# Patient Record
Sex: Male | Born: 1955 | Race: Black or African American | Hispanic: No | Marital: Married
Health system: Southern US, Community
[De-identification: ages and names within clinical notes are randomized; demographics above are authoritative.]

## PROBLEM LIST (undated history)

## (undated) DIAGNOSIS — J189 Pneumonia, unspecified organism: Secondary | ICD-10-CM

## (undated) DIAGNOSIS — R6520 Severe sepsis without septic shock: Secondary | ICD-10-CM

## (undated) DIAGNOSIS — A419 Sepsis, unspecified organism: Secondary | ICD-10-CM

## (undated) DIAGNOSIS — U071 COVID-19: Secondary | ICD-10-CM

## (undated) DIAGNOSIS — J9621 Acute and chronic respiratory failure with hypoxia: Secondary | ICD-10-CM

## (undated) DIAGNOSIS — J1282 Pneumonia due to coronavirus disease 2019: Secondary | ICD-10-CM

---

## 2019-07-22 ENCOUNTER — Other Ambulatory Visit (HOSPITAL_COMMUNITY): Payer: Medicare HMO

## 2019-07-22 ENCOUNTER — Inpatient Hospital Stay
Admission: AD | Admit: 2019-07-22 | Discharge: 2019-08-19 | Disposition: A | Discharge status: Skilled Nursing Facility | Payer: Medicare HMO | Source: Other Acute Inpatient Hospital | Attending: Internal Medicine | Admitting: Internal Medicine

## 2019-07-22 DIAGNOSIS — J9621 Acute and chronic respiratory failure with hypoxia: Secondary | ICD-10-CM | POA: Diagnosis present

## 2019-07-22 DIAGNOSIS — U071 COVID-19: Secondary | ICD-10-CM | POA: Diagnosis present

## 2019-07-22 DIAGNOSIS — J189 Pneumonia, unspecified organism: Secondary | ICD-10-CM

## 2019-07-22 DIAGNOSIS — J1282 Pneumonia due to coronavirus disease 2019: Secondary | ICD-10-CM | POA: Diagnosis present

## 2019-07-22 DIAGNOSIS — R1110 Vomiting, unspecified: Secondary | ICD-10-CM

## 2019-07-22 DIAGNOSIS — R0902 Hypoxemia: Secondary | ICD-10-CM

## 2019-07-22 DIAGNOSIS — K567 Ileus, unspecified: Secondary | ICD-10-CM

## 2019-07-22 DIAGNOSIS — R6520 Severe sepsis without septic shock: Secondary | ICD-10-CM | POA: Diagnosis present

## 2019-07-22 DIAGNOSIS — A419 Sepsis, unspecified organism: Secondary | ICD-10-CM | POA: Diagnosis present

## 2019-07-22 DIAGNOSIS — Z931 Gastrostomy status: Secondary | ICD-10-CM

## 2019-07-22 DIAGNOSIS — Z9289 Personal history of other medical treatment: Secondary | ICD-10-CM

## 2019-07-22 HISTORY — DX: Pneumonia due to coronavirus disease 2019: J12.82

## 2019-07-22 HISTORY — DX: Sepsis, unspecified organism: R65.20

## 2019-07-22 HISTORY — DX: Pneumonia, unspecified organism: J18.9

## 2019-07-22 HISTORY — DX: Sepsis, unspecified organism: A41.9

## 2019-07-22 HISTORY — DX: Acute and chronic respiratory failure with hypoxia: J96.21

## 2019-07-22 HISTORY — DX: COVID-19: U07.1

## 2019-07-22 LAB — BLOOD GAS, ARTERIAL
Acid-Base Excess: 4.8 mmol/L — ABNORMAL HIGH (ref 0.0–2.0)
Bicarbonate: 28.2 mmol/L — ABNORMAL HIGH (ref 20.0–28.0)
FIO2: 28
O2 Saturation: 98.9 %
Patient temperature: 36.4
pCO2 arterial: 36.8 mmHg (ref 32.0–48.0)
pH, Arterial: 7.494 — ABNORMAL HIGH (ref 7.350–7.450)
pO2, Arterial: 116 mmHg — ABNORMAL HIGH (ref 83.0–108.0)

## 2019-07-23 ENCOUNTER — Encounter: Payer: Self-pay | Admitting: Internal Medicine

## 2019-07-23 DIAGNOSIS — J189 Pneumonia, unspecified organism: Secondary | ICD-10-CM | POA: Diagnosis present

## 2019-07-23 DIAGNOSIS — J9621 Acute and chronic respiratory failure with hypoxia: Secondary | ICD-10-CM

## 2019-07-23 DIAGNOSIS — A419 Sepsis, unspecified organism: Secondary | ICD-10-CM | POA: Diagnosis not present

## 2019-07-23 DIAGNOSIS — J1282 Pneumonia due to coronavirus disease 2019: Secondary | ICD-10-CM | POA: Diagnosis present

## 2019-07-23 DIAGNOSIS — R6520 Severe sepsis without septic shock: Secondary | ICD-10-CM

## 2019-07-23 DIAGNOSIS — U071 COVID-19: Secondary | ICD-10-CM | POA: Diagnosis present

## 2019-07-23 LAB — COMPREHENSIVE METABOLIC PANEL
ALT: 18 U/L (ref 0–44)
AST: 37 U/L (ref 15–41)
Albumin: 1.9 g/dL — ABNORMAL LOW (ref 3.5–5.0)
Alkaline Phosphatase: 107 U/L (ref 38–126)
Anion gap: 9 (ref 5–15)
BUN: 8 mg/dL (ref 8–23)
CO2: 23 mmol/L (ref 22–32)
Calcium: 8.7 mg/dL — ABNORMAL LOW (ref 8.9–10.3)
Chloride: 100 mmol/L (ref 98–111)
Creatinine, Ser: <0.3 mg/dL — ABNORMAL LOW (ref 0.61–1.24)
Glucose, Bld: 137 mg/dL — ABNORMAL HIGH (ref 70–99)
Potassium: 4.4 mmol/L (ref 3.5–5.1)
Sodium: 132 mmol/L — ABNORMAL LOW (ref 135–145)
Total Bilirubin: 1.5 mg/dL — ABNORMAL HIGH (ref 0.3–1.2)
Total Protein: 6.4 g/dL — ABNORMAL LOW (ref 6.5–8.1)

## 2019-07-23 LAB — CBC WITH DIFFERENTIAL/PLATELET
Abs Immature Granulocytes: 0.03 10*3/uL (ref 0.00–0.07)
Basophils Absolute: 0 10*3/uL (ref 0.0–0.1)
Basophils Relative: 0 %
Eosinophils Absolute: 0.1 10*3/uL (ref 0.0–0.5)
Eosinophils Relative: 2 %
HCT: 31.2 % — ABNORMAL LOW (ref 39.0–52.0)
Hemoglobin: 10 g/dL — ABNORMAL LOW (ref 13.0–17.0)
Immature Granulocytes: 0 %
Lymphocytes Relative: 13 %
Lymphs Abs: 1 10*3/uL (ref 0.7–4.0)
MCH: 26 pg (ref 26.0–34.0)
MCHC: 32.1 g/dL (ref 30.0–36.0)
MCV: 81.3 fL (ref 80.0–100.0)
Monocytes Absolute: 0.7 10*3/uL (ref 0.1–1.0)
Monocytes Relative: 10 %
Neutro Abs: 5.6 10*3/uL (ref 1.7–7.7)
Neutrophils Relative %: 75 %
Platelets: 295 10*3/uL (ref 150–400)
RBC: 3.84 MIL/uL — ABNORMAL LOW (ref 4.22–5.81)
RDW: 17.8 % — ABNORMAL HIGH (ref 11.5–15.5)
WBC: 7.5 10*3/uL (ref 4.0–10.5)
nRBC: 0 % (ref 0.0–0.2)

## 2019-07-23 MED ORDER — INSULIN GLARGINE 100 UNIT/ML SOLOSTAR PEN
10.00 | PEN_INJECTOR | SUBCUTANEOUS | Status: DC
Start: 2019-07-23 — End: 2019-07-23

## 2019-07-23 MED ORDER — ARFORMOTEROL TARTRATE 15 MCG/2ML IN NEBU
15.00 | INHALATION_SOLUTION | RESPIRATORY_TRACT | Status: DC
Start: 2019-07-22 — End: 2019-07-23

## 2019-07-23 MED ORDER — POTASSIUM CHLORIDE 20 MEQ PO PACK
20.00 | PACK | ORAL | Status: DC
Start: 2019-07-23 — End: 2019-07-23

## 2019-07-23 MED ORDER — ASPIRIN 81 MG PO CHEW
81.00 | CHEWABLE_TABLET | ORAL | Status: DC
Start: 2019-07-23 — End: 2019-07-23

## 2019-07-23 MED ORDER — IPRATROPIUM-ALBUTEROL 0.5-2.5 (3) MG/3ML IN SOLN
3.00 | RESPIRATORY_TRACT | Status: DC
Start: ? — End: 2019-07-23

## 2019-07-23 MED ORDER — METOCLOPRAMIDE HCL 10 MG/10ML PO SOLN
15.00 | ORAL | Status: DC
Start: 2019-07-22 — End: 2019-07-23

## 2019-07-23 MED ORDER — PANTOPRAZOLE SODIUM 40 MG IV SOLR
40.00 | INTRAVENOUS | Status: DC
Start: 2019-07-23 — End: 2019-07-23

## 2019-07-23 MED ORDER — LORAZEPAM 2 MG/ML IJ SOLN
1.00 | INTRAMUSCULAR | Status: DC
Start: ? — End: 2019-07-23

## 2019-07-23 MED ORDER — CYANOCOBALAMIN 500 MCG PO TABS
500.00 | ORAL_TABLET | ORAL | Status: DC
Start: 2019-07-23 — End: 2019-07-23

## 2019-07-23 MED ORDER — IPRATROPIUM-ALBUTEROL 0.5-2.5 (3) MG/3ML IN SOLN
3.00 | RESPIRATORY_TRACT | Status: DC
Start: 2019-07-22 — End: 2019-07-23

## 2019-07-23 MED ORDER — ACETYLCYSTEINE 20 % IN SOLN
2.00 | RESPIRATORY_TRACT | Status: DC
Start: 2019-07-22 — End: 2019-07-23

## 2019-07-23 MED ORDER — THERA-M PO TABS
1.00 | ORAL_TABLET | ORAL | Status: DC
Start: 2019-07-23 — End: 2019-07-23

## 2019-07-23 MED ORDER — DEXTROSE 50 % IV SOLN
50.00 | INTRAVENOUS | Status: DC
Start: ? — End: 2019-07-23

## 2019-07-23 MED ORDER — INSULIN REGULAR HUMAN 100 UNIT/ML IJ SOLN
0.00 | INTRAMUSCULAR | Status: DC
Start: 2019-07-22 — End: 2019-07-23

## 2019-07-23 MED ORDER — BUDESONIDE 0.5 MG/2ML IN SUSP
0.50 | RESPIRATORY_TRACT | Status: DC
Start: 2019-07-23 — End: 2019-07-23

## 2019-07-23 MED ORDER — COCONUT OIL OIL
TOPICAL_OIL | Status: DC
Start: ? — End: 2019-07-23

## 2019-07-23 NOTE — Consult Note (Signed)
Pulmonary [place]  Date of Service: 07/23/2019  PULMONARY CRITICAL CARE CONSULT   Caleb Rhodes  KZL:[id number]  DOB: [birth year]   DOA: 07/22/2019  Referring Physician: Merton Border, MD  HPI: Caleb Rhodes is a 64 y.o. male seen for follow up of Acute on Chronic Respiratory Failure.  Patient has multiple medical problems including chronic alcohol abuse anxiety diabetes mellitus depression hypertension hyperlipidemia malignant neoplasm of the prostate pneumonia presents to the hospital for altered mental status.  The patient was seen by the wife and was not acting right.  Patient does have some issues with dementia.  In the emergency department patient had to be intubated because of the climbing respiratory status.  Patient had a CT angiogram done which did not show any acute abnormalities.  CT angiogram was also done which was unremarkable.  Hospital course was as follows.  CT angiogram revealed presence of bibasilar pneumonia patient apparently had bibasilar pneumonia on the CT scan.  The patient also was tested for COVID-19.  There was no evidence of pulmonary embolism noted.  Patient required ongoing sedation because of his history of dementia.  Transferred to our facility for further management and weaning.  Review of Systems:  ROS performed and is unremarkable other than noted above.  Medical Hx: Past Medical History:  Diagnosis Date  . Alcohol abuse  . Anxiety  . Colon cancer (CMS-HCC)  . Depression  . DM type 2 (diabetes mellitus, type 2) (CMS-HCC)  . H/O echocardiogram  08/30/16 EF 60%  . History of EKG  09/04/16 S TACH, LT ANT FAS BLK  . History of EKG  08/11/17 NSR, FAS. BLOCK,CONSIDER ANT.ISCHEMIA DR Caleb Rhodes  . Hyperlipemia  . Hypertension, benign  . Malignant neoplasm prostate (CMS-HCC)  . Plantar fibromatosis  . Pneumonia  08/2016  . Stroke (CMS-HCC)  08/2016  . Tobacco abuse   Surgical Hx: Past Surgical  History:  Procedure Laterality Date  . ANKLE FRACTURE SURGERY  . CARPAL TUNNEL RELEASE  . Metastatic colorectal cancer 2012  . PR AMPUTATION FOOT,TRANSMETATARSAL Right 05/14/2019  Procedure: AMPUTATION, FOOT; TRANSMETATARSAL; Surgeon: Ardeth Sportsman, DPM; Location: [facility]; Service: Podiatry  . PR COLONOSCOPY FLX DX W/COLLJ SPEC WHEN PFRMD N/A 05/01/2017  Procedure: COLONOSCOPY, FLEXIBLE, PROXIMAL TO SPLENIC FLEXURE; DIAGNOSTIC, W/WO COLLECTION SPECIMEN BY BRUSH OR [place]; Surgeon: Annie Main, MD; Location: [facility]; Service: General Surgery  . PR DEBRIDEMENT, SKIN, SUB-Q TISSUE,MUSCLE,BONE,=<20 SQ CM Right 01/17/2019  Procedure: DEBRIDEMENT; SKIN, SUBCUTANEOUS TISSUE, MUSCLE, & BONE FOOT; Surgeon: Gweneth Fritter, DPM; Location: [facility]; Service: Podiatry  . PR UPPER GI ENDOSCOPY,BIOPSY N/A 09/06/2016  Procedure: UGI ENDOSCOPY; WITH BIOPSY, SINGLE OR MULTIPLE, POSSIBLE CONTROL OF BLEEDING; Surgeon: Annie Main, MD; Location: [facility]; Service: General Surgery  . PR UPPER GI ENDOSCOPY,DIAGNOSIS N/A 05/01/2017  Procedure: UGI ENDO, INCLUDE ESOPHAGUS, STOMACH, & DUODENUM &/OR JEJUNUM; DX W/WO COLLECTION SPECIMN, BY BRUSH OR [place]; Surgeon: Annie Main, MD; Location: [facility]; Service: General Surgery  . PROSTATE SURGERY  . Surgery for preforated sigmoid colon 2006    Medications: Reviewed on Rounds  Physical Exam:  Vitals: Temperature 98.7 pulse 87 respiratory 24 blood pressure is 114/67 saturations 99%  Ventilator Settings mode ventilation pressure support FiO2 28% pressure support 12 PEEP 5  . General: Comfortable at this time . Eyes: Grossly normal lids, irises & conjunctiva . ENT: grossly tongue is normal . Neck: no obvious mass . Cardiovascular: S1-S2 normal  no gallop or rub . Respiratory: No rhonchi coarse breath sounds . Abdomen: Soft and nontender . Skin: no rash seen on limited exam . Musculoskeletal: not  rigid . Psychiatric:unable to assess . Neurologic: no seizure no involuntary movements         Labs on Admission:  Basic Metabolic Panel: Recent Labs  Lab 07/23/19 0652  NA 132*  K 4.4  CL 100  CO2 23  GLUCOSE 137*  BUN 8  CREATININE <0.30*  CALCIUM 8.7*    Recent Labs  Lab 07/22/19 1855  PHART 7.494*  PCO2ART 36.8  PO2ART 116*  HCO3 28.2*  O2SAT 98.9    Liver Function Tests: Recent Labs  Lab 07/23/19 0652  AST 37  ALT 18  ALKPHOS 107  BILITOT 1.5*  PROT 6.4*  ALBUMIN 1.9*   No results for input(s): LIPASE, AMYLASE in the last 168 hours. No results for input(s): AMMONIA in the last 168 hours.  CBC: No results for input(s): WBC, NEUTROABS, HGB, HCT, MCV, PLT in the last 168 hours.  Cardiac Enzymes: No results for input(s): CKTOTAL, CKMB, CKMBINDEX, TROPONINI in the last 168 hours.  BNP (last 3 results) No results for input(s): BNP in the last 8760 hours.  ProBNP (last 3 results) No results for input(s): PROBNP in the last 8760 hours.   Radiological Exams on Admission: DG Abd 1 View  Result Date: 07/22/2019 CLINICAL DATA:  Status post percutaneous gastrostomy tube placement EXAM: ABDOMEN - 1 VIEW COMPARISON:  None. FINDINGS: 50 mL Omnipaque 300 injected into patient's gastrostomy tube. Gastrostomy balloon appears positioned within the proximal body of the stomach. Contrast opacifies the stomach. No gross extravasation. Nonobstructed gas pattern IMPRESSION: Gastrostomy tube projects within the stomach. No gross extravasation Electronically Signed   By: Jasmine Pang M.D.   On: 07/22/2019 18:37   DG CHEST PORT 1 VIEW  Result Date: 07/22/2019 CLINICAL DATA:  History of pneumonia EXAM: PORTABLE CHEST 1 VIEW COMPARISON:  None. FINDINGS: Tracheostomy tube in place with tip about 4.7 cm superior to the carina. Left upper extremity central venous catheter tip over the distal SVC. Mild right infrahilar airspace opacity. No pleural effusion. Normal heart size. No  pneumothorax. IMPRESSION: Mild right infrahilar atelectasis or small pneumonia. Minimal streaky atelectasis in the left infrahilar lung Electronically Signed   By: Jasmine Pang M.D.   On: 07/22/2019 18:38    Assessment/Plan Active Problems:   Acute on chronic respiratory failure with hypoxia (HCC)   Severe sepsis (HCC)   Multifocal pneumonia   COVID-19 virus infection   Pneumonia due to COVID-19 virus   1. Acute on chronic respiratory failure hypoxia patient right now is on the weaning protocol goals for about 2 hours on pressure support 12/5.  Patient so far appears to be doing well.  Concern is over the agitation which will need to be monitored closely. 2. Severe sepsis with shock right now hemodynamics are stable we will continue to monitor closely.  Patient had been on pressors at the other facility. 3. Multifocal pneumonia patient has been treated with cefepime vancomycin azithromycin as well as Rocephin.  Will monitor closely. 4. COVID-19 pneumonia patient has been treated clinically showing some residual deficits on the chest films. 5. COVID-19 virus infection in the resolution phase we will continue to follow along.  I have personally seen and evaluated the patient, evaluated laboratory and imaging results, formulated the assessment and plan and placed orders. The Patient requires high complexity decision making with multiple systems involvement.  Case  was discussed on Rounds with the Respiratory Therapy Director and the Respiratory staff Time Spent  Yevonne Pax, MD [facility] Pulmonary Critical Care Medicine Sleep Medicine

## 2019-07-24 ENCOUNTER — Other Ambulatory Visit (HOSPITAL_COMMUNITY): Payer: Medicare HMO

## 2019-07-24 DIAGNOSIS — A419 Sepsis, unspecified organism: Secondary | ICD-10-CM | POA: Diagnosis not present

## 2019-07-24 DIAGNOSIS — U071 COVID-19: Secondary | ICD-10-CM | POA: Diagnosis not present

## 2019-07-24 DIAGNOSIS — J9621 Acute and chronic respiratory failure with hypoxia: Secondary | ICD-10-CM | POA: Diagnosis not present

## 2019-07-24 DIAGNOSIS — J189 Pneumonia, unspecified organism: Secondary | ICD-10-CM | POA: Diagnosis not present

## 2019-07-24 LAB — URINALYSIS, ROUTINE W REFLEX MICROSCOPIC
Glucose, UA: NEGATIVE mg/dL
Ketones, ur: 15 mg/dL — AB
Nitrite: NEGATIVE
Protein, ur: 100 mg/dL — AB
Specific Gravity, Urine: 1.015 (ref 1.005–1.030)
pH: 7.5 (ref 5.0–8.0)

## 2019-07-24 LAB — URINALYSIS, MICROSCOPIC (REFLEX): RBC / HPF: >50 RBC/hpf (ref 0–5)

## 2019-07-24 NOTE — Progress Notes (Addendum)
Pulmonary Critical Care Medicine [place]   PULMONARY CRITICAL CARE SERVICE  PROGRESS NOTE  Date of Service: 07/24/2019  Caleb Rhodes  ZOX:[id number]  DOB: Jul 12, 1955   DOA: 07/22/2019  Referring Physician: Merton Border, MD  HPI: Caleb Rhodes is a 64 y.o. male seen for follow up of Acute on Chronic Respiratory Failure.  Patient had a 48-hour goal today on pressure support completed 4 hours and is working towards 8 hours currently on 12/5 FiO2 28% satting well no distress.  Medications: Reviewed on Rounds  Physical Exam:  Vitals: Pulse 107 respirations 20 BP 114/64 O2 sat 9% temp 97.9  Ventilator Settings per support 12/5 FiO2 28%  . General: Comfortable at this time . Eyes: Grossly normal lids, irises & conjunctiva . ENT: grossly tongue is normal . Neck: no obvious mass . Cardiovascular: S1 S2 normal no gallop . Respiratory: Coarse breath sounds . Abdomen: soft . Skin: no rash seen on limited exam . Musculoskeletal: not rigid . Psychiatric:unable to assess . Neurologic: no seizure no involuntary movements         Lab Data:   Basic Metabolic Panel: Recent Labs  Lab 07/23/19 0652  NA 132*  K 4.4  CL 100  CO2 23  GLUCOSE 137*  BUN 8  CREATININE <0.30*  CALCIUM 8.7*    ABG: Recent Labs  Lab 07/22/19 1855  PHART 7.494*  PCO2ART 36.8  PO2ART 116*  HCO3 28.2*  O2SAT 98.9    Liver Function Tests: Recent Labs  Lab 07/23/19 0652  AST 37  ALT 18  ALKPHOS 107  BILITOT 1.5*  PROT 6.4*  ALBUMIN 1.9*   No results for input(s): LIPASE, AMYLASE in the last 168 hours. No results for input(s): AMMONIA in the last 168 hours.  CBC: Recent Labs  Lab 07/23/19 0904  WBC 7.5  NEUTROABS 5.6  HGB 10.0*  HCT 31.2*  MCV 81.3  PLT 295    Cardiac Enzymes: No results for input(s): CKTOTAL, CKMB, CKMBINDEX, TROPONINI in the last 168 hours.  BNP (last 3 results) No results for input(s): BNP in the last 8760 hours.  ProBNP (last 3  results) No results for input(s): PROBNP in the last 8760 hours.  Radiological Exams: DG Abd 1 View  Result Date: 07/22/2019 CLINICAL DATA:  Status post percutaneous gastrostomy tube placement EXAM: ABDOMEN - 1 VIEW COMPARISON:  None. FINDINGS: 50 mL Omnipaque 300 injected into patient's gastrostomy tube. Gastrostomy balloon appears positioned within the proximal body of the stomach. Contrast opacifies the stomach. No gross extravasation. Nonobstructed gas pattern IMPRESSION: Gastrostomy tube projects within the stomach. No gross extravasation Electronically Signed   By: Donavan Foil M.D.   On: 07/22/2019 18:37   DG CHEST PORT 1 VIEW  Result Date: 07/22/2019 CLINICAL DATA:  History of pneumonia EXAM: PORTABLE CHEST 1 VIEW COMPARISON:  None. FINDINGS: Tracheostomy tube in place with tip about 4.7 cm superior to the carina. Left upper extremity central venous catheter tip over the distal SVC. Mild right infrahilar airspace opacity. No pleural effusion. Normal heart size. No pneumothorax. IMPRESSION: Mild right infrahilar atelectasis or small pneumonia. Minimal streaky atelectasis in the left infrahilar lung Electronically Signed   By: Donavan Foil M.D.   On: 07/22/2019 18:38    Assessment/Plan Active Problems:   Acute on chronic respiratory failure with hypoxia (HCC)   Severe sepsis (HCC)   Multifocal pneumonia   COVID-19 virus infection   Pneumonia due to COVID-19 virus   1. Acute on chronic respiratory  failure hypoxia continue to wean per protocol.  Currently going for 8 hours per support this time.  Continue supportive measures and pulmonary toilet. 2. Severe sepsis with shock right now hemodynamics are stable we will continue to monitor closely.  Patient had been on pressors at the other facility. 3. Multifocal pneumonia patient has been treated with cefepime vancomycin azithromycin as well as Rocephin.  Will monitor closely. 4. COVID-19 pneumonia patient has been treated clinically  showing some residual deficits on the chest films. 5. COVID-19 virus infection in the resolution phase we will continue to follow along.   I have personally seen and evaluated the patient, evaluated laboratory and imaging results, formulated the assessment and plan and placed orders. The Patient requires high complexity decision making with multiple systems involvement.  Rounds were done with the Respiratory Therapy Director and Staff therapists and discussed with nursing staff also.  Yevonne Pax, MD [facility] Pulmonary Critical Care Medicine Sleep Medicine

## 2019-07-25 DIAGNOSIS — J9621 Acute and chronic respiratory failure with hypoxia: Secondary | ICD-10-CM | POA: Diagnosis not present

## 2019-07-25 DIAGNOSIS — U071 COVID-19: Secondary | ICD-10-CM | POA: Diagnosis not present

## 2019-07-25 DIAGNOSIS — J189 Pneumonia, unspecified organism: Secondary | ICD-10-CM | POA: Diagnosis not present

## 2019-07-25 DIAGNOSIS — A419 Sepsis, unspecified organism: Secondary | ICD-10-CM | POA: Diagnosis not present

## 2019-07-25 LAB — BASIC METABOLIC PANEL
Anion gap: 9 (ref 5–15)
BUN: 6 mg/dL — ABNORMAL LOW (ref 8–23)
CO2: 27 mmol/L (ref 22–32)
Calcium: 8.8 mg/dL — ABNORMAL LOW (ref 8.9–10.3)
Chloride: 101 mmol/L (ref 98–111)
Creatinine, Ser: <0.3 mg/dL — ABNORMAL LOW (ref 0.61–1.24)
Glucose, Bld: 173 mg/dL — ABNORMAL HIGH (ref 70–99)
Potassium: 3.3 mmol/L — ABNORMAL LOW (ref 3.5–5.1)
Sodium: 137 mmol/L (ref 135–145)

## 2019-07-25 LAB — CBC
HCT: 30 % — ABNORMAL LOW (ref 39.0–52.0)
Hemoglobin: 9.4 g/dL — ABNORMAL LOW (ref 13.0–17.0)
MCH: 25.8 pg — ABNORMAL LOW (ref 26.0–34.0)
MCHC: 31.3 g/dL (ref 30.0–36.0)
MCV: 82.2 fL (ref 80.0–100.0)
Platelets: 308 10*3/uL (ref 150–400)
RBC: 3.65 MIL/uL — ABNORMAL LOW (ref 4.22–5.81)
RDW: 17.3 % — ABNORMAL HIGH (ref 11.5–15.5)
WBC: 6.2 10*3/uL (ref 4.0–10.5)
nRBC: 0 % (ref 0.0–0.2)

## 2019-07-25 LAB — URINE CULTURE: Culture: NO GROWTH

## 2019-07-25 NOTE — Progress Notes (Addendum)
Pulmonary Critical Care Medicine [facility] [place]   PULMONARY CRITICAL CARE SERVICE  PROGRESS NOTE  Date of Service: 07/25/2019  Caleb Rhodes  DEY:[id number]  DOB: [birth year]   DOA: 07/22/2019  Referring Physician: Carron Curie, MD  HPI: Caleb Rhodes is a 64 y.o. male seen for follow up of Acute on Chronic Respiratory Failure.  Patient remains on pressure support with an FiO2 28% currently satting well no fever distress.  Medications: Reviewed on Rounds  Physical Exam:  Vitals: Pulse 89 respirations 23 BP 118/70 O2 sat 99% temp 98.3  Ventilator Settings per support 12/5 FiO2 28%  . General: Comfortable at this time . Eyes: Grossly normal lids, irises & conjunctiva . ENT: grossly tongue is normal . Neck: no obvious mass . Cardiovascular: S1 S2 normal no gallop . Respiratory: No rales or rhonchi noted . Abdomen: soft . Skin: no rash seen on limited exam . Musculoskeletal: not rigid . Psychiatric:unable to assess . Neurologic: no seizure no involuntary movements         Lab Data:   Basic Metabolic Panel: Recent Labs  Lab 07/23/19 0652 07/25/19 0617  NA 132* 137  K 4.4 3.3*  CL 100 101  CO2 23 27  GLUCOSE 137* 173*  BUN 8 6*  CREATININE <0.30* <0.30*  CALCIUM 8.7* 8.8*    ABG: Recent Labs  Lab 07/22/19 1855  PHART 7.494*  PCO2ART 36.8  PO2ART 116*  HCO3 28.2*  O2SAT 98.9    Liver Function Tests: Recent Labs  Lab 07/23/19 0652  AST 37  ALT 18  ALKPHOS 107  BILITOT 1.5*  PROT 6.4*  ALBUMIN 1.9*   No results for input(s): LIPASE, AMYLASE in the last 168 hours. No results for input(s): AMMONIA in the last 168 hours.  CBC: Recent Labs  Lab 07/23/19 0904 07/25/19 0617  WBC 7.5 6.2  NEUTROABS 5.6  --   HGB 10.0* 9.4*  HCT 31.2* 30.0*  MCV 81.3 82.2  PLT 295 308    Cardiac Enzymes: No results for input(s): CKTOTAL, CKMB, CKMBINDEX, TROPONINI in the last 168 hours.  BNP (last 3 results) No results for input(s): BNP in  the last 8760 hours.  ProBNP (last 3 results) No results for input(s): PROBNP in the last 8760 hours.  Radiological Exams: DG Abd 1 View  Result Date: 07/24/2019 CLINICAL DATA:  Emesis. EXAM: ABDOMEN - 1 VIEW COMPARISON:  July 22, 2019 FINDINGS: The PEG tube again projects over the left upper quadrant. Contrast injected through the PEG tube 2 days ago is now located within the colon. No evidence of bowel obstruction. No free air, portal venous gas, or pneumatosis. No other acute abnormalities are identified. IMPRESSION: No cause for the patient's symptoms identified. Electronically Signed   By: Gerome Sam III M.D   On: 07/24/2019 18:24    Assessment/Plan Active Problems:   Acute on chronic respiratory failure with hypoxia (HCC)   Severe sepsis (HCC)   Multifocal pneumonia   COVID-19 virus infection   Pneumonia due to COVID-19 virus   1. Acute on chronic respiratory failure hypoxia continue to wean per protocol.  Currently going for 12 hours per support this time.  Continue supportive measures and pulmonary toilet. 2. Severe sepsis with shock right now hemodynamics are stable we will continue to monitor closely. Patient had been on pressors at the other facility. 3. Multifocal pneumonia patient has been treated with cefepime vancomycin azithromycin as well as Rocephin. Will monitor closely. 4. COVID-19 pneumonia patient has been  treated clinically showing some residual deficits on the chest films. 5. COVID-19 virus infection in the resolution phase we will continue to follow along.   I have personally seen and evaluated the patient, evaluated laboratory and imaging results, formulated the assessment and plan and placed orders. The Patient requires high complexity decision making with multiple systems involvement.  Rounds were done with the Respiratory Therapy Director and Staff therapists and discussed with nursing staff also.  Allyne Gee, MD [facility] Pulmonary Critical Care  Medicine Sleep Medicine

## 2019-07-26 DIAGNOSIS — J189 Pneumonia, unspecified organism: Secondary | ICD-10-CM | POA: Diagnosis not present

## 2019-07-26 DIAGNOSIS — U071 COVID-19: Secondary | ICD-10-CM | POA: Diagnosis not present

## 2019-07-26 DIAGNOSIS — J9621 Acute and chronic respiratory failure with hypoxia: Secondary | ICD-10-CM | POA: Diagnosis not present

## 2019-07-26 DIAGNOSIS — A419 Sepsis, unspecified organism: Secondary | ICD-10-CM | POA: Diagnosis not present

## 2019-07-26 NOTE — Progress Notes (Signed)
Pulmonary Critical Care Medicine [facility] [place]   PULMONARY CRITICAL CARE SERVICE  PROGRESS NOTE  Date of Service: 07/26/2019  Caleb Rhodes  DJM:[id number]  DOB: Jul 31, 1955   DOA: 07/22/2019  Referring Physician: Carron Curie, MD  HPI: Caleb Rhodes is a 64 y.o. male seen for follow up of Acute on Chronic Respiratory Failure.  Patient is on pressure support mode and today's goal is for about 16 hours  Medications: Reviewed on Rounds  Physical Exam:  Vitals: Temperature 98.7 pulse 86 respiratory 19 blood pressure is 102/65 saturations 98%  Ventilator Settings mode of ventilation pressure support FiO2 28% pressure 12 PEEP 5  . General: Comfortable at this time . Eyes: Grossly normal lids, irises & conjunctiva . ENT: grossly tongue is normal . Neck: no obvious mass . Cardiovascular: S1 S2 normal no gallop . Respiratory: No rhonchi no rales are noted at this time . Abdomen: soft . Skin: no rash seen on limited exam . Musculoskeletal: not rigid . Psychiatric:unable to assess . Neurologic: no seizure no involuntary movements         Lab Data:   Basic Metabolic Panel: Recent Labs  Lab 07/23/19 0652 07/25/19 0617  NA 132* 137  K 4.4 3.3*  CL 100 101  CO2 23 27  GLUCOSE 137* 173*  BUN 8 6*  CREATININE <0.30* <0.30*  CALCIUM 8.7* 8.8*    ABG: Recent Labs  Lab 07/22/19 1855  PHART 7.494*  PCO2ART 36.8  PO2ART 116*  HCO3 28.2*  O2SAT 98.9    Liver Function Tests: Recent Labs  Lab 07/23/19 0652  AST 37  ALT 18  ALKPHOS 107  BILITOT 1.5*  PROT 6.4*  ALBUMIN 1.9*   No results for input(s): LIPASE, AMYLASE in the last 168 hours. No results for input(s): AMMONIA in the last 168 hours.  CBC: Recent Labs  Lab 07/23/19 0904 07/25/19 0617  WBC 7.5 6.2  NEUTROABS 5.6  --   HGB 10.0* 9.4*  HCT 31.2* 30.0*  MCV 81.3 82.2  PLT 295 308    Cardiac Enzymes: No results for input(s): CKTOTAL, CKMB, CKMBINDEX, TROPONINI in the last 168  hours.  BNP (last 3 results) No results for input(s): BNP in the last 8760 hours.  ProBNP (last 3 results) No results for input(s): PROBNP in the last 8760 hours.  Radiological Exams: DG Abd 1 View  Result Date: 07/24/2019 CLINICAL DATA:  Emesis. EXAM: ABDOMEN - 1 VIEW COMPARISON:  July 22, 2019 FINDINGS: The PEG tube again projects over the left upper quadrant. Contrast injected through the PEG tube 2 days ago is now located within the colon. No evidence of bowel obstruction. No free air, portal venous gas, or pneumatosis. No other acute abnormalities are identified. IMPRESSION: No cause for the patient's symptoms identified. Electronically Signed   By: Gerome Sam III M.D   On: 07/24/2019 18:24    Assessment/Plan Active Problems:   Acute on chronic respiratory failure with hypoxia (HCC)   Severe sepsis (HCC)   Multifocal pneumonia   COVID-19 virus infection   Pneumonia due to COVID-19 virus   1. Acute on chronic respiratory failure with hypoxia plan is to continue with pressure support 12/5 the goal is for 16 hours 2. Severe sepsis hemodynamics are stable 3. Multifocal pneumonia treated 4. COVID-19 virus infection resolution phase 5. Pneumonia due to COVID-19 we will continue with supportive care slowly improving   I have personally seen and evaluated the patient, evaluated laboratory and imaging results, formulated the  assessment and plan and placed orders. The Patient requires high complexity decision making with multiple systems involvement.  Rounds were done with the Respiratory Therapy Director and Staff therapists and discussed with nursing staff also.  Allyne Gee, MD [facility] Pulmonary Critical Care Medicine Sleep Medicine

## 2019-07-27 DIAGNOSIS — U071 COVID-19: Secondary | ICD-10-CM | POA: Diagnosis not present

## 2019-07-27 DIAGNOSIS — A419 Sepsis, unspecified organism: Secondary | ICD-10-CM | POA: Diagnosis not present

## 2019-07-27 DIAGNOSIS — J189 Pneumonia, unspecified organism: Secondary | ICD-10-CM | POA: Diagnosis not present

## 2019-07-27 DIAGNOSIS — J9621 Acute and chronic respiratory failure with hypoxia: Secondary | ICD-10-CM | POA: Diagnosis not present

## 2019-07-27 NOTE — Progress Notes (Signed)
Pulmonary Critical Care Medicine [facility] [place]   PULMONARY CRITICAL CARE SERVICE  PROGRESS NOTE  Date of Service: 07/27/2019  WOODFORD STREGE  XBL:[id number]  DOB: [birth year]   DOA: 07/22/2019  Referring Physician: Carron Curie, MD  HPI: Caleb Rhodes is a 64 y.o. male seen for follow up of Acute on Chronic Respiratory Failure.  Patient at this time is on the NAG on 2 L the goal today is for 2 hours  Medications: Reviewed on Rounds  Physical Exam:  Vitals: Temperature 97.0 pulse 86 respiratory 18 blood pressure is 145/84 saturations 98%  Ventilator Settings on the NAG on 2 L oxygen  . General: Comfortable at this time . Eyes: Grossly normal lids, irises & conjunctiva . ENT: grossly tongue is normal . Neck: no obvious mass . Cardiovascular: S1 S2 normal no gallop . Respiratory: No rhonchi coarse breath sounds . Abdomen: soft . Skin: no rash seen on limited exam . Musculoskeletal: not rigid . Psychiatric:unable to assess . Neurologic: no seizure no involuntary movements         Lab Data:   Basic Metabolic Panel: Recent Labs  Lab 07/23/19 0652 07/25/19 0617  NA 132* 137  K 4.4 3.3*  CL 100 101  CO2 23 27  GLUCOSE 137* 173*  BUN 8 6*  CREATININE <0.30* <0.30*  CALCIUM 8.7* 8.8*    ABG: Recent Labs  Lab 07/22/19 1855  PHART 7.494*  PCO2ART 36.8  PO2ART 116*  HCO3 28.2*  O2SAT 98.9    Liver Function Tests: Recent Labs  Lab 07/23/19 0652  AST 37  ALT 18  ALKPHOS 107  BILITOT 1.5*  PROT 6.4*  ALBUMIN 1.9*   No results for input(s): LIPASE, AMYLASE in the last 168 hours. No results for input(s): AMMONIA in the last 168 hours.  CBC: Recent Labs  Lab 07/23/19 0904 07/25/19 0617  WBC 7.5 6.2  NEUTROABS 5.6  --   HGB 10.0* 9.4*  HCT 31.2* 30.0*  MCV 81.3 82.2  PLT 295 308    Cardiac Enzymes: No results for input(s): CKTOTAL, CKMB, CKMBINDEX, TROPONINI in the last 168 hours.  BNP (last 3 results) No results for input(s):  BNP in the last 8760 hours.  ProBNP (last 3 results) No results for input(s): PROBNP in the last 8760 hours.  Radiological Exams: No results found.  Assessment/Plan Active Problems:   Acute on chronic respiratory failure with hypoxia (HCC)   Severe sepsis (HCC)   Multifocal pneumonia   COVID-19 virus infection   Pneumonia due to COVID-19 virus   1. Acute on chronic respiratory failure hypoxia plan continue with NAG and oxygen therapy on 2 L oxygen for 2 hours 2. Severe sepsis hemodynamics are stable resolved 3. Multifocal pneumonia improving we will continue with supportive care 4. COVID-19 virus infection treated 5. Pneumonia due to COVID-19 resolving   I have personally seen and evaluated the patient, evaluated laboratory and imaging results, formulated the assessment and plan and placed orders. The Patient requires high complexity decision making with multiple systems involvement.  Rounds were done with the Respiratory Therapy Director and Staff therapists and discussed with nursing staff also.  Yevonne Pax, MD [facility] Pulmonary Critical Care Medicine Sleep Medicine

## 2019-07-28 DIAGNOSIS — A419 Sepsis, unspecified organism: Secondary | ICD-10-CM | POA: Diagnosis not present

## 2019-07-28 DIAGNOSIS — U071 COVID-19: Secondary | ICD-10-CM | POA: Diagnosis not present

## 2019-07-28 DIAGNOSIS — J189 Pneumonia, unspecified organism: Secondary | ICD-10-CM | POA: Diagnosis not present

## 2019-07-28 DIAGNOSIS — J9621 Acute and chronic respiratory failure with hypoxia: Secondary | ICD-10-CM | POA: Diagnosis not present

## 2019-07-28 LAB — CBC
HCT: 30.4 % — ABNORMAL LOW (ref 39.0–52.0)
Hemoglobin: 9.5 g/dL — ABNORMAL LOW (ref 13.0–17.0)
MCH: 25.3 pg — ABNORMAL LOW (ref 26.0–34.0)
MCHC: 31.3 g/dL (ref 30.0–36.0)
MCV: 81.1 fL (ref 80.0–100.0)
Platelets: 383 10*3/uL (ref 150–400)
RBC: 3.75 MIL/uL — ABNORMAL LOW (ref 4.22–5.81)
RDW: 17.5 % — ABNORMAL HIGH (ref 11.5–15.5)
WBC: 8 10*3/uL (ref 4.0–10.5)
nRBC: 0 % (ref 0.0–0.2)

## 2019-07-28 LAB — BASIC METABOLIC PANEL
Anion gap: 11 (ref 5–15)
BUN: 11 mg/dL (ref 8–23)
CO2: 28 mmol/L (ref 22–32)
Calcium: 9.2 mg/dL (ref 8.9–10.3)
Chloride: 101 mmol/L (ref 98–111)
Creatinine, Ser: 0.35 mg/dL — ABNORMAL LOW (ref 0.61–1.24)
GFR calc Af Amer: >60 mL/min (ref >60)
GFR calc non Af Amer: >60 mL/min (ref >60)
Glucose, Bld: 173 mg/dL — ABNORMAL HIGH (ref 70–99)
Potassium: 3.6 mmol/L (ref 3.5–5.1)
Sodium: 140 mmol/L (ref 135–145)

## 2019-07-28 NOTE — Progress Notes (Signed)
Pulmonary Critical Care Medicine [facility] [place]   PULMONARY CRITICAL CARE SERVICE  PROGRESS NOTE  Date of Service: 07/28/2019  Caleb Rhodes  [id number]  DOB: 06/05/1956   DOA: 07/22/2019  Referring Physician: Carron Curie, MD  HPI: Caleb Rhodes is a 64 y.o. male seen for follow up of Acute on Chronic Respiratory Failure.  Patient is on the NAG right now has been on 2 L oxygen with goal is for 6 hours  Medications: Reviewed on Rounds  Physical Exam:  Vitals: Temperature 97.6 pulse 96 respiratory rate 16 blood pressure is one 4/65 saturations 99%  Ventilator Settings off the ventilator on the NAG  . General: Comfortable at this time . Eyes: Grossly normal lids, irises & conjunctiva . ENT: grossly tongue is normal . Neck: no obvious mass . Cardiovascular: S1 S2 normal no gallop . Respiratory: No rhonchi coarse breath sounds . Abdomen: soft . Skin: no rash seen on limited exam . Musculoskeletal: not rigid . Psychiatric:unable to assess . Neurologic: no seizure no involuntary movements         Lab Data:   Basic Metabolic Panel: Recent Labs  Lab 07/23/19 0652 07/25/19 0617 07/28/19 0655  NA 132* 137 140  K 4.4 3.3* 3.6  CL 100 101 101  CO2 23 27 28   GLUCOSE 137* 173* 173*  BUN 8 6* 11  CREATININE <0.30* <0.30* 0.35*  CALCIUM 8.7* 8.8* 9.2    ABG: Recent Labs  Lab 07/22/19 1855  PHART 7.494*  PCO2ART 36.8  PO2ART 116*  HCO3 28.2*  O2SAT 98.9    Liver Function Tests: Recent Labs  Lab 07/23/19 0652  AST 37  ALT 18  ALKPHOS 107  BILITOT 1.5*  PROT 6.4*  ALBUMIN 1.9*   No results for input(s): LIPASE, AMYLASE in the last 168 hours. No results for input(s): AMMONIA in the last 168 hours.  CBC: Recent Labs  Lab 07/23/19 0904 07/25/19 0617 07/28/19 0655  WBC 7.5 6.2 8.0  NEUTROABS 5.6  --   --   HGB 10.0* 9.4* 9.5*  HCT 31.2* 30.0* 30.4*  MCV 81.3 82.2 81.1  PLT 295 308 383    Cardiac Enzymes: No results for  input(s): CKTOTAL, CKMB, CKMBINDEX, TROPONINI in the last 168 hours.  BNP (last 3 results) No results for input(s): BNP in the last 8760 hours.  ProBNP (last 3 results) No results for input(s): PROBNP in the last 8760 hours.  Radiological Exams: No results found.  Assessment/Plan Active Problems:   Acute on chronic respiratory failure with hypoxia (HCC)   Severe sepsis (HCC)   Multifocal pneumonia   COVID-19 virus infection   Pneumonia due to COVID-19 virus   1. Acute on chronic respiratory failure hypoxia plan is to continue monitoring protocol today the goal is for 6 hours off the vent completely. 2. Severe sepsis resolved 3. Multifocal pneumonia treated clinically improving 4. COVID-19 virus infection treated pneumonitis improving clinically 5. Pneumonia due to COVID-19 slowly improving we will continue with supportive care   I have personally seen and evaluated the patient, evaluated laboratory and imaging results, formulated the assessment and plan and placed orders. The Patient requires high complexity decision making with multiple systems involvement.  Rounds were done with the Respiratory Therapy Director and Staff therapists and discussed with nursing staff also.  10/13/2019, MD [facility] Pulmonary Critical Care Medicine Sleep Medicine

## 2019-07-29 DIAGNOSIS — J9621 Acute and chronic respiratory failure with hypoxia: Secondary | ICD-10-CM | POA: Diagnosis not present

## 2019-07-29 DIAGNOSIS — J189 Pneumonia, unspecified organism: Secondary | ICD-10-CM | POA: Diagnosis not present

## 2019-07-29 DIAGNOSIS — U071 COVID-19: Secondary | ICD-10-CM | POA: Diagnosis not present

## 2019-07-29 DIAGNOSIS — A419 Sepsis, unspecified organism: Secondary | ICD-10-CM | POA: Diagnosis not present

## 2019-07-29 NOTE — Progress Notes (Signed)
Pulmonary Critical Care Medicine [facility] [place]   PULMONARY CRITICAL CARE SERVICE  PROGRESS NOTE  Date of Service: 07/29/2019  Caleb Rhodes  [id number]  DOB: May 13, 1956   DOA: 07/22/2019  Referring Physician: Carron Curie, MD  HPI: Caleb Rhodes is a 64 y.o. male seen for follow up of Acute on Chronic Respiratory Failure.  Patient currently is off the ventilator on the NAG has been on 2 L the goal of 16 hours  Medications: Reviewed on Rounds  Physical Exam:  Vitals: Temperature 97.3 pulse 94 respiratory rate 20 blood pressure is 106/67 saturations 99%  Ventilator Settings on the NAG on 2 L  . General: Comfortable at this time . Eyes: Grossly normal lids, irises & conjunctiva . ENT: grossly tongue is normal . Neck: no obvious mass . Cardiovascular: S1 S2 normal no gallop . Respiratory: No rhonchi no rales . Abdomen: soft . Skin: no rash seen on limited exam . Musculoskeletal: not rigid . Psychiatric:unable to assess . Neurologic: no seizure no involuntary movements         Lab Data:   Basic Metabolic Panel: Recent Labs  Lab 07/23/19 0652 07/25/19 0617 07/28/19 0655  NA 132* 137 140  K 4.4 3.3* 3.6  CL 100 101 101  CO2 23 27 28   GLUCOSE 137* 173* 173*  BUN 8 6* 11  CREATININE <0.30* <0.30* 0.35*  CALCIUM 8.7* 8.8* 9.2    ABG: Recent Labs  Lab 07/22/19 1855  PHART 7.494*  PCO2ART 36.8  PO2ART 116*  HCO3 28.2*  O2SAT 98.9    Liver Function Tests: Recent Labs  Lab 07/23/19 0652  AST 37  ALT 18  ALKPHOS 107  BILITOT 1.5*  PROT 6.4*  ALBUMIN 1.9*   No results for input(s): LIPASE, AMYLASE in the last 168 hours. No results for input(s): AMMONIA in the last 168 hours.  CBC: Recent Labs  Lab 07/23/19 0904 07/25/19 0617 07/28/19 0655  WBC 7.5 6.2 8.0  NEUTROABS 5.6  --   --   HGB 10.0* 9.4* 9.5*  HCT 31.2* 30.0* 30.4*  MCV 81.3 82.2 81.1  PLT 295 308 383    Cardiac Enzymes: No results for input(s): CKTOTAL, CKMB,  CKMBINDEX, TROPONINI in the last 168 hours.  BNP (last 3 results) No results for input(s): BNP in the last 8760 hours.  ProBNP (last 3 results) No results for input(s): PROBNP in the last 8760 hours.  Radiological Exams: No results found.  Assessment/Plan Active Problems:   Acute on chronic respiratory failure with hypoxia (HCC)   Severe sepsis (HCC)   Multifocal pneumonia   COVID-19 virus infection   Pneumonia due to COVID-19 virus   1. Acute on chronic respiratory failure hypoxia plan is to continue with weaning on the NAG goal of 16 hours 2. Severe sepsis hemodynamics are stable 3. Multifocal pneumonia improving 4. COVID-19 virus infection treated we will continue to follow 5. Pneumonia due to COVID-19 gradually improved   I have personally seen and evaluated the patient, evaluated laboratory and imaging results, formulated the assessment and plan and placed orders. The Patient requires high complexity decision making with multiple systems involvement.  Rounds were done with the Respiratory Therapy Director and Staff therapists and discussed with nursing staff also.  10/06/2019, MD [facility] Pulmonary Critical Care Medicine Sleep Medicine

## 2019-07-30 DIAGNOSIS — J9621 Acute and chronic respiratory failure with hypoxia: Secondary | ICD-10-CM | POA: Diagnosis not present

## 2019-07-30 DIAGNOSIS — A419 Sepsis, unspecified organism: Secondary | ICD-10-CM | POA: Diagnosis not present

## 2019-07-30 DIAGNOSIS — U071 COVID-19: Secondary | ICD-10-CM | POA: Diagnosis not present

## 2019-07-30 DIAGNOSIS — J189 Pneumonia, unspecified organism: Secondary | ICD-10-CM | POA: Diagnosis not present

## 2019-07-30 NOTE — Progress Notes (Signed)
Pulmonary Critical Care Medicine [facility] [place]   PULMONARY CRITICAL CARE SERVICE  PROGRESS NOTE  Date of Service: 07/30/2019  Caleb Rhodes  [id number]:[id number]  DOB: 06-07-1956   DOA: 07/22/2019  Referring Physician: Carron Curie, MD  HPI: Caleb Rhodes is a 64 y.o. male seen for follow up of Acute on Chronic Respiratory Failure.  Patient was off the ventilator on the NAG supposed to do 24 hours  Medications: Reviewed on Rounds  Physical Exam:  Vitals: Temperature 97.3 pulse 96 respiratory rate 13 blood pressure 94/56 saturations 99%  Ventilator Settings off the ventilator on the NAG goal is 24 hours  . General: Comfortable at this time . Eyes: Grossly normal lids, irises & conjunctiva . ENT: grossly tongue is normal . Neck: no obvious mass . Cardiovascular: S1 S2 normal no gallop . Respiratory: No rhonchi no rales are noted at this time . Abdomen: soft . Skin: no rash seen on limited exam . Musculoskeletal: not rigid . Psychiatric:unable to assess . Neurologic: no seizure no involuntary movements         Lab Data:   Basic Metabolic Panel: Recent Labs  Lab 07/25/19 0617 07/28/19 0655  NA 137 140  K 3.3* 3.6  CL 101 101  CO2 27 28  GLUCOSE 173* 173*  BUN 6* 11  CREATININE <0.30* 0.35*  CALCIUM 8.8* 9.2    ABG: No results for input(s): PHART, PCO2ART, PO2ART, HCO3, O2SAT in the last 168 hours.  Liver Function Tests: No results for input(s): AST, ALT, ALKPHOS, BILITOT, PROT, ALBUMIN in the last 168 hours. No results for input(s): LIPASE, AMYLASE in the last 168 hours. No results for input(s): AMMONIA in the last 168 hours.  CBC: Recent Labs  Lab 07/25/19 0617 07/28/19 0655  WBC 6.2 8.0  HGB 9.4* 9.5*  HCT 30.0* 30.4*  MCV 82.2 81.1  PLT 308 383    Cardiac Enzymes: No results for input(s): CKTOTAL, CKMB, CKMBINDEX, TROPONINI in the last 168 hours.  BNP (last 3 results) No results for input(s): BNP in the last 8760 hours.  ProBNP  (last 3 results) No results for input(s): PROBNP in the last 8760 hours.  Radiological Exams: No results found.  Assessment/Plan Active Problems:   Acute on chronic respiratory failure with hypoxia (HCC)   Severe sepsis (HCC)   Multifocal pneumonia   COVID-19 virus infection   Pneumonia due to COVID-19 virus   1. Acute on chronic respiratory failure with hypoxia plan is to continue weaning as tolerated 2. Severe sepsis resolved hemodynamics stable 3. Multifocal pneumonia no change 4. COVID-19 infection treated resolving 5. Pneumonia due to COVID-19 slow to improve patient is doing well with weaning   I have personally seen and evaluated the patient, evaluated laboratory and imaging results, formulated the assessment and plan and placed orders. The Patient requires high complexity decision making with multiple systems involvement.  Rounds were done with the Respiratory Therapy Director and Staff therapists and discussed with nursing staff also.  Yevonne Pax, MD [facility] Pulmonary Critical Care Medicine Sleep Medicine

## 2019-07-31 DIAGNOSIS — J189 Pneumonia, unspecified organism: Secondary | ICD-10-CM | POA: Diagnosis not present

## 2019-07-31 DIAGNOSIS — U071 COVID-19: Secondary | ICD-10-CM | POA: Diagnosis not present

## 2019-07-31 DIAGNOSIS — A419 Sepsis, unspecified organism: Secondary | ICD-10-CM | POA: Diagnosis not present

## 2019-07-31 DIAGNOSIS — J9621 Acute and chronic respiratory failure with hypoxia: Secondary | ICD-10-CM | POA: Diagnosis not present

## 2019-07-31 LAB — CULTURE, RESPIRATORY W GRAM STAIN

## 2019-07-31 NOTE — Progress Notes (Addendum)
Pulmonary Critical Care Medicine [facility] [place]   PULMONARY CRITICAL CARE SERVICE  PROGRESS NOTE  Date of Service: 07/31/2019  Caleb Rhodes  [id number]  DOB: [birth year]   DOA: 07/22/2019  Referring Physician: Carron Curie, MD  HPI: Caleb Rhodes is a 64 y.o. male seen for follow up of Acute on Chronic Respiratory Failure.  Patient remains on NAG this time for 48-hour goal.  Currently satting well no distress.  Medications: Reviewed on Rounds  Physical Exam:  Vitals: Pulse 60 respirations 21 BP 144/75 O2 sat 98% temp 96.0  Ventilator Settings NAG 28%  . General: Comfortable at this time . Eyes: Grossly normal lids, irises & conjunctiva . ENT: grossly tongue is normal . Neck: no obvious mass . Cardiovascular: S1 S2 normal no gallop . Respiratory: No rales or rhonchi noted . Abdomen: soft . Skin: no rash seen on limited exam . Musculoskeletal: not rigid . Psychiatric:unable to assess . Neurologic: no seizure no involuntary movements         Lab Data:   Basic Metabolic Panel: Recent Labs  Lab 07/25/19 0617 07/28/19 0655  NA 137 140  K 3.3* 3.6  CL 101 101  CO2 27 28  GLUCOSE 173* 173*  BUN 6* 11  CREATININE <0.30* 0.35*  CALCIUM 8.8* 9.2    ABG: No results for input(s): PHART, PCO2ART, PO2ART, HCO3, O2SAT in the last 168 hours.  Liver Function Tests: No results for input(s): AST, ALT, ALKPHOS, BILITOT, PROT, ALBUMIN in the last 168 hours. No results for input(s): LIPASE, AMYLASE in the last 168 hours. No results for input(s): AMMONIA in the last 168 hours.  CBC: Recent Labs  Lab 07/25/19 0617 07/28/19 0655  WBC 6.2 8.0  HGB 9.4* 9.5*  HCT 30.0* 30.4*  MCV 82.2 81.1  PLT 308 383    Cardiac Enzymes: No results for input(s): CKTOTAL, CKMB, CKMBINDEX, TROPONINI in the last 168 hours.  BNP (last 3 results) No results for input(s): BNP in the last 8760 hours.  ProBNP (last 3 results) No results for input(s): PROBNP in the last  8760 hours.  Radiological Exams: No results found.  Assessment/Plan Active Problems:   Acute on chronic respiratory failure with hypoxia (HCC)   Severe sepsis (HCC)   Multifocal pneumonia   COVID-19 virus infection   Pneumonia due to COVID-19 virus   1. Acute on chronic respiratory failure with hypoxia plan is to continue weaning as tolerated 2. Severe sepsis resolved hemodynamics stable 3. Multifocal pneumonia no change 4. COVID-19 infection treated resolving 5. Pneumonia due to COVID-19 slow to improve patient is doing well with weaning   I have personally seen and evaluated the patient, evaluated laboratory and imaging results, formulated the assessment and plan and placed orders. The Patient requires high complexity decision making with multiple systems involvement.  Rounds were done with the Respiratory Therapy Director and Staff therapists and discussed with nursing staff also.  Yevonne Pax, MD [facility] Pulmonary Critical Care Medicine Sleep Medicine

## 2019-08-01 DIAGNOSIS — J189 Pneumonia, unspecified organism: Secondary | ICD-10-CM | POA: Diagnosis not present

## 2019-08-01 DIAGNOSIS — A419 Sepsis, unspecified organism: Secondary | ICD-10-CM | POA: Diagnosis not present

## 2019-08-01 DIAGNOSIS — J9621 Acute and chronic respiratory failure with hypoxia: Secondary | ICD-10-CM | POA: Diagnosis not present

## 2019-08-01 DIAGNOSIS — U071 COVID-19: Secondary | ICD-10-CM | POA: Diagnosis not present

## 2019-08-01 LAB — BASIC METABOLIC PANEL
Anion gap: 13 (ref 5–15)
BUN: 15 mg/dL (ref 8–23)
CO2: 20 mmol/L — ABNORMAL LOW (ref 22–32)
Calcium: 8.7 mg/dL — ABNORMAL LOW (ref 8.9–10.3)
Chloride: 106 mmol/L (ref 98–111)
Creatinine, Ser: 0.31 mg/dL — ABNORMAL LOW (ref 0.61–1.24)
GFR calc Af Amer: >60 mL/min (ref >60)
GFR calc non Af Amer: >60 mL/min (ref >60)
Glucose, Bld: 186 mg/dL — ABNORMAL HIGH (ref 70–99)
Potassium: 4.9 mmol/L (ref 3.5–5.1)
Sodium: 139 mmol/L (ref 135–145)

## 2019-08-01 LAB — MAGNESIUM: Magnesium: 1.9 mg/dL (ref 1.7–2.4)

## 2019-08-01 LAB — VANCOMYCIN, TROUGH: Vancomycin Tr: 14 ug/mL — ABNORMAL LOW (ref 15–20)

## 2019-08-01 NOTE — Progress Notes (Addendum)
Pulmonary Critical Care Medicine [facility] [place]   PULMONARY CRITICAL CARE SERVICE  PROGRESS NOTE  Date of Service: 08/01/2019  Caleb Rhodes  [id number]  DOB: Feb 04, 1956   DOA: 07/22/2019  Referring Physician: Carron Curie, MD  HPI: Caleb Rhodes is a 64 y.o. male seen for follow up of Acute on Chronic Respiratory Failure.  Patient remains on NAG 1 L for 72-hour goal at this time.  Satting well no distress.  Medications: Reviewed on Rounds  Physical Exam:  Vitals: Pulse 100 respirations 24 BP 109/68 O2 sat 100% temp 97.1  Ventilator Settings 1 L NAG  . General: Comfortable at this time . Eyes: Grossly normal lids, irises & conjunctiva . ENT: grossly tongue is normal . Neck: no obvious mass . Cardiovascular: S1 S2 normal no gallop . Respiratory: No rales or rhonchi noted . Abdomen: soft . Skin: no rash seen on limited exam . Musculoskeletal: not rigid . Psychiatric:unable to assess . Neurologic: no seizure no involuntary movements         Lab Data:   Basic Metabolic Panel: Recent Labs  Lab 07/28/19 0655 08/01/19 1421  NA 140 139  K 3.6 4.9  CL 101 106  CO2 28 20*  GLUCOSE 173* 186*  BUN 11 15  CREATININE 0.35* 0.31*  CALCIUM 9.2 8.7*  MG  --  1.9    ABG: No results for input(s): PHART, PCO2ART, PO2ART, HCO3, O2SAT in the last 168 hours.  Liver Function Tests: No results for input(s): AST, ALT, ALKPHOS, BILITOT, PROT, ALBUMIN in the last 168 hours. No results for input(s): LIPASE, AMYLASE in the last 168 hours. No results for input(s): AMMONIA in the last 168 hours.  CBC: Recent Labs  Lab 07/28/19 0655  WBC 8.0  HGB 9.5*  HCT 30.4*  MCV 81.1  PLT 383    Cardiac Enzymes: No results for input(s): CKTOTAL, CKMB, CKMBINDEX, TROPONINI in the last 168 hours.  BNP (last 3 results) No results for input(s): BNP in the last 8760 hours.  ProBNP (last 3 results) No results for input(s): PROBNP in the last 8760 hours.  Radiological  Exams: No results found.  Assessment/Plan Active Problems:   Acute on chronic respiratory failure with hypoxia (HCC)   Severe sepsis (HCC)   Multifocal pneumonia   COVID-19 virus infection   Pneumonia due to COVID-19 virus   1. Acute on chronic respiratory failure with hypoxia plan is to continue weaning as tolerated 2. Severe sepsis resolved hemodynamics stable 3. Multifocal pneumonia no change 4. COVID-19 infection treated resolving 5. Pneumonia due to COVID-19 slow to improve patient is doing well with weaning   I have personally seen and evaluated the patient, evaluated laboratory and imaging results, formulated the assessment and plan and placed orders. The Patient requires high complexity decision making with multiple systems involvement.  Rounds were done with the Respiratory Therapy Director and Staff therapists and discussed with nursing staff also.  Yevonne Pax, MD [facility] Pulmonary Critical Care Medicine Sleep Medicine

## 2019-08-02 DIAGNOSIS — J9621 Acute and chronic respiratory failure with hypoxia: Secondary | ICD-10-CM | POA: Diagnosis not present

## 2019-08-02 DIAGNOSIS — U071 COVID-19: Secondary | ICD-10-CM | POA: Diagnosis not present

## 2019-08-02 DIAGNOSIS — A419 Sepsis, unspecified organism: Secondary | ICD-10-CM | POA: Diagnosis not present

## 2019-08-02 DIAGNOSIS — J189 Pneumonia, unspecified organism: Secondary | ICD-10-CM | POA: Diagnosis not present

## 2019-08-02 NOTE — Progress Notes (Signed)
Pulmonary Critical Care Medicine [facility] [place]   PULMONARY CRITICAL CARE SERVICE  PROGRESS NOTE  Date of Service: 08/02/2019  Caleb Rhodes  [id number]  DOB: 01/02/1956   DOA: 07/22/2019  Referring Physician: Carron Curie, MD  HPI: [name] is a 64 y.o. male seen for follow up of Acute on Chronic Respiratory Failure.  Patient is on the NAG on 1 L has been doing well for 48 hours  Medications: Reviewed on Rounds  Physical Exam:  Vitals: Temperature is 97.0 pulse 87 respiratory 23 blood pressure is 129/77 saturations 100%  Ventilator Settings off the ventilator on the NAG  . General: Comfortable at this time . Eyes: Grossly normal lids, irises & conjunctiva . ENT: grossly tongue is normal . Neck: no obvious mass . Cardiovascular: S1 S2 normal no gallop . Respiratory: Scattered rhonchi expansion is equal . Abdomen: soft . Skin: no rash seen on limited exam . Musculoskeletal: not rigid . Psychiatric:unable to assess . Neurologic: no seizure no involuntary movements         Lab Data:   Basic Metabolic Panel: Recent Labs  Lab 07/28/19 0655 08/01/19 1421  NA 140 139  K 3.6 4.9  CL 101 106  CO2 28 20*  GLUCOSE 173* 186*  BUN 11 15  CREATININE 0.35* 0.31*  CALCIUM 9.2 8.7*  MG  --  1.9    ABG: No results for input(s): PHART, PCO2ART, PO2ART, HCO3, O2SAT in the last 168 hours.  Liver Function Tests: No results for input(s): AST, ALT, ALKPHOS, BILITOT, PROT, ALBUMIN in the last 168 hours. No results for input(s): LIPASE, AMYLASE in the last 168 hours. No results for input(s): AMMONIA in the last 168 hours.  CBC: Recent Labs  Lab 07/28/19 0655  WBC 8.0  HGB 9.5*  HCT 30.4*  MCV 81.1  PLT 383    Cardiac Enzymes: No results for input(s): CKTOTAL, CKMB, CKMBINDEX, TROPONINI in the last 168 hours.  BNP (last 3 results) No results for input(s): BNP in the last 8760 hours.  ProBNP (last 3 results) No results for input(s): PROBNP  in the last 8760 hours.  Radiological Exams: No results found.  Assessment/Plan Active Problems:   Acute on chronic respiratory failure with hypoxia (HCC)   Severe sepsis (HCC)   Multifocal pneumonia   COVID-19 virus infection   Pneumonia due to COVID-19 virus   1. Acute on chronic respiratory failure with hypoxia plan is to continue to wean on the NAG today will be completing 48 hours 2. Severe sepsis resolved hemodynamics are stable 3. Multifocal pneumonia treated we will continue to follow 4. COVID-19 virus infection improving 5. Pneumonia due to COVID-19 also showing improvement   I have personally seen and evaluated the patient, evaluated laboratory and imaging results, formulated the assessment and plan and placed orders. The Patient requires high complexity decision making with multiple systems involvement.  Rounds were done with the Respiratory Therapy Director and Staff therapists and discussed with nursing staff also.  Yevonne Pax, MD [facility] Pulmonary Critical Care Medicine Sleep Medicine

## 2019-08-03 ENCOUNTER — Other Ambulatory Visit (HOSPITAL_COMMUNITY): Payer: Medicare HMO

## 2019-08-03 DIAGNOSIS — J189 Pneumonia, unspecified organism: Secondary | ICD-10-CM | POA: Diagnosis not present

## 2019-08-03 DIAGNOSIS — U071 COVID-19: Secondary | ICD-10-CM | POA: Diagnosis not present

## 2019-08-03 DIAGNOSIS — A419 Sepsis, unspecified organism: Secondary | ICD-10-CM | POA: Diagnosis not present

## 2019-08-03 DIAGNOSIS — J9621 Acute and chronic respiratory failure with hypoxia: Secondary | ICD-10-CM | POA: Diagnosis not present

## 2019-08-03 LAB — VANCOMYCIN, TROUGH: Vancomycin Tr: 16 ug/mL (ref 15–20)

## 2019-08-03 LAB — OCCULT BLOOD GASTRIC / DUODENUM (SPECIMEN CUP): Occult Blood, Gastric: POSITIVE — AB

## 2019-08-03 NOTE — Progress Notes (Signed)
Pulmonary Critical Care Medicine [facility] [place]   PULMONARY CRITICAL CARE SERVICE  PROGRESS NOTE  Date of Service: 08/03/2019  Caleb Rhodes  [id number]  DOB: [birth year]   DOA: 07/22/2019  Referring Physician: Carron Curie, MD  HPI: Caleb Rhodes is a 64 y.o. male seen for follow up of Acute on Chronic Respiratory Failure.  Patient at this time is off the ventilator on the NAG rate now is requiring 1 L oxygen  Medications: Reviewed on Rounds  Physical Exam:  Vitals: Temperature 96.2 pulse 92 respiratory 22 blood pressure is 106/63 saturations 100%  Ventilator Settings off the ventilator on the NAG on 1 L oxygen  . General: Comfortable at this time . Eyes: Grossly normal lids, irises & conjunctiva . ENT: grossly tongue is normal . Neck: no obvious mass . Cardiovascular: S1 S2 normal no gallop . Respiratory: Scattered rhonchi expansion is equal . Abdomen: soft . Skin: no rash seen on limited exam . Musculoskeletal: not rigid . Psychiatric:unable to assess . Neurologic: no seizure no involuntary movements         Lab Data:   Basic Metabolic Panel: Recent Labs  Lab 07/28/19 0655 08/01/19 1421  NA 140 139  K 3.6 4.9  CL 101 106  CO2 28 20*  GLUCOSE 173* 186*  BUN 11 15  CREATININE 0.35* 0.31*  CALCIUM 9.2 8.7*  MG  --  1.9    ABG: No results for input(s): PHART, PCO2ART, PO2ART, HCO3, O2SAT in the last 168 hours.  Liver Function Tests: No results for input(s): AST, ALT, ALKPHOS, BILITOT, PROT, ALBUMIN in the last 168 hours. No results for input(s): LIPASE, AMYLASE in the last 168 hours. No results for input(s): AMMONIA in the last 168 hours.  CBC: Recent Labs  Lab 07/28/19 0655  WBC 8.0  HGB 9.5*  HCT 30.4*  MCV 81.1  PLT 383    Cardiac Enzymes: No results for input(s): CKTOTAL, CKMB, CKMBINDEX, TROPONINI in the last 168 hours.  BNP (last 3 results) No results for input(s): BNP in the last 8760 hours.  ProBNP (last 3  results) No results for input(s): PROBNP in the last 8760 hours.  Radiological Exams: DG Abd 1 View  Result Date: 08/03/2019 CLINICAL DATA:  Ileus, unspecified EXAM: ABDOMEN - 1 VIEW COMPARISON:  Radiograph 07/24/2019 FINDINGS: Linear radiodensity projecting over the midline abdomen, likely external to the patient. Surgical clips noted in the right upper quadrant and left lower quadrant. A rounded lucency in the left upper quadrant likely reflects an inflated balloon for a percutaneous gastrostomy tube. Contrast material has since passed to the level of the rectum. No residual obstructive bowel gas pattern is seen. Osseous structures are unchanged from comparison. No acute soft tissue abnormality. IMPRESSION: 1. No obstructive bowel gas pattern is seen. Contrast material has passed to the level of the rectum. 2. Linear radiodensity projecting over the midline abdomen, likely external to the patient. 3. 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 Electronically Signed   By: Kreg Shropshire M.D.   On: 08/03/2019 03:33    Assessment/Plan Active Problems:   Acute on chronic respiratory failure with hypoxia (HCC)   Severe sepsis (HCC)   Multifocal pneumonia   COVID-19 virus infection   Pneumonia due to COVID-19 virus   1. Acute on chronic respiratory failure hypoxia plan is to continue with NAG as tolerated continue secretion management supportive care 2. Severe sepsis resolved hemodynamics are stable 3. Multifocal pneumonia treated we will continue present management 4.  COVID-19 virus infection resolved 5. Pneumonia due to COVID-19 patient is improved clinically however still has residual deficits noted on the chest films   I have personally seen and evaluated the patient, evaluated laboratory and imaging results, formulated the assessment and plan and placed orders. The Patient requires high complexity decision making with multiple systems involvement.  Rounds were done with the  Respiratory Therapy Director and Staff therapists and discussed with nursing staff also.  Allyne Gee, MD [facility] Pulmonary Critical Care Medicine Sleep Medicine

## 2019-08-04 DIAGNOSIS — U071 COVID-19: Secondary | ICD-10-CM | POA: Diagnosis not present

## 2019-08-04 DIAGNOSIS — J189 Pneumonia, unspecified organism: Secondary | ICD-10-CM | POA: Diagnosis not present

## 2019-08-04 DIAGNOSIS — J9621 Acute and chronic respiratory failure with hypoxia: Secondary | ICD-10-CM | POA: Diagnosis not present

## 2019-08-04 DIAGNOSIS — A419 Sepsis, unspecified organism: Secondary | ICD-10-CM | POA: Diagnosis not present

## 2019-08-04 NOTE — Progress Notes (Addendum)
Pulmonary Critical Care Medicine [facility] [place]   PULMONARY CRITICAL CARE SERVICE  PROGRESS NOTE  Date of Service: 08/04/2019  KOREN SERMERSHEIM  [id number]  DOB: 1956-06-11   DOA: 07/22/2019  Referring Physician: Carron Curie, MD  HPI: KEMARI NAREZ is a 64 y.o. male seen for follow up of Acute on Chronic Respiratory Failure.  Patient remains on 1 L NAG unable to cath due to secretions.  Satting well no distress.  Medications: Reviewed on Rounds  Physical Exam:  Vitals: Pulse 98 respirations 20 BP 134/34 O2 sat 99% temp 97.6  Ventilator Settings NAG 1 L  . General: Comfortable at this time . Eyes: Grossly normal lids, irises & conjunctiva . ENT: grossly tongue is normal . Neck: no obvious mass . Cardiovascular: S1 S2 normal no gallop . Respiratory: No rales or rhonchi noted . Abdomen: soft . Skin: no rash seen on limited exam . Musculoskeletal: not rigid . Psychiatric:unable to assess . Neurologic: no seizure no involuntary movements         Lab Data:   Basic Metabolic Panel: Recent Labs  Lab 08/01/19 1421  NA 139  K 4.9  CL 106  CO2 20*  GLUCOSE 186*  BUN 15  CREATININE 0.31*  CALCIUM 8.7*  MG 1.9    ABG: No results for input(s): PHART, PCO2ART, PO2ART, HCO3, O2SAT in the last 168 hours.  Liver Function Tests: No results for input(s): AST, ALT, ALKPHOS, BILITOT, PROT, ALBUMIN in the last 168 hours. No results for input(s): LIPASE, AMYLASE in the last 168 hours. No results for input(s): AMMONIA in the last 168 hours.  CBC: No results for input(s): WBC, NEUTROABS, HGB, HCT, MCV, PLT in the last 168 hours.  Cardiac Enzymes: No results for input(s): CKTOTAL, CKMB, CKMBINDEX, TROPONINI in the last 168 hours.  BNP (last 3 results) No results for input(s): BNP in the last 8760 hours.  ProBNP (last 3 results) No results for input(s): PROBNP in the last 8760 hours.  Radiological Exams: DG Abd 1 View  Result Date: 08/03/2019 CLINICAL  DATA:  Ileus, unspecified EXAM: ABDOMEN - 1 VIEW COMPARISON:  Radiograph 07/24/2019 FINDINGS: Linear radiodensity projecting over the midline abdomen, likely external to the patient. Surgical clips noted in the right upper quadrant and left lower quadrant. A rounded lucency in the left upper quadrant likely reflects an inflated balloon for a percutaneous gastrostomy tube. Contrast material has since passed to the level of the rectum. No residual obstructive bowel gas pattern is seen. Osseous structures are unchanged from comparison. No acute soft tissue abnormality. IMPRESSION: 1. No obstructive bowel gas pattern is seen. Contrast material has passed to the level of the rectum. 2. Linear radiodensity projecting over the midline abdomen, likely external to the patient. 3. 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 Electronically Signed   By: Kreg Shropshire M.D.   On: 08/03/2019 03:33    Assessment/Plan Active Problems:   Acute on chronic respiratory failure with hypoxia (HCC)   Severe sepsis (HCC)   Multifocal pneumonia   COVID-19 virus infection   Pneumonia due to COVID-19 virus   1. Acute on chronic respiratory failure hypoxia plan is to continue with NAG as tolerated continue secretion management supportive care 2. Severe sepsis resolved hemodynamics are stable 3. Multifocal pneumonia treated we will continue present management 4. COVID-19 virus infection resolved 5. Pneumonia due to COVID-19 patient is improved clinically however still has residual deficits noted on the chest films   I have personally seen and  evaluated the patient, evaluated laboratory and imaging results, formulated the assessment and plan and placed orders. The Patient requires high complexity decision making with multiple systems involvement.  Rounds were done with the Respiratory Therapy Director and Staff therapists and discussed with nursing staff also.  Allyne Gee, MD [facility] Pulmonary Critical Care  Medicine Sleep Medicine

## 2019-08-05 DIAGNOSIS — U071 COVID-19: Secondary | ICD-10-CM | POA: Diagnosis not present

## 2019-08-05 DIAGNOSIS — J189 Pneumonia, unspecified organism: Secondary | ICD-10-CM | POA: Diagnosis not present

## 2019-08-05 DIAGNOSIS — J9621 Acute and chronic respiratory failure with hypoxia: Secondary | ICD-10-CM | POA: Diagnosis not present

## 2019-08-05 DIAGNOSIS — A419 Sepsis, unspecified organism: Secondary | ICD-10-CM | POA: Diagnosis not present

## 2019-08-05 LAB — CBC
HCT: 28 % — ABNORMAL LOW (ref 39.0–52.0)
Hemoglobin: 8.8 g/dL — ABNORMAL LOW (ref 13.0–17.0)
MCH: 26.3 pg (ref 26.0–34.0)
MCHC: 31.4 g/dL (ref 30.0–36.0)
MCV: 83.6 fL (ref 80.0–100.0)
Platelets: 331 10*3/uL (ref 150–400)
RBC: 3.35 MIL/uL — ABNORMAL LOW (ref 4.22–5.81)
RDW: 18.6 % — ABNORMAL HIGH (ref 11.5–15.5)
WBC: 8.8 10*3/uL (ref 4.0–10.5)
nRBC: 0 % (ref 0.0–0.2)

## 2019-08-05 LAB — BASIC METABOLIC PANEL
Anion gap: 9 (ref 5–15)
BUN: 10 mg/dL (ref 8–23)
CO2: 27 mmol/L (ref 22–32)
Calcium: 8.9 mg/dL (ref 8.9–10.3)
Chloride: 102 mmol/L (ref 98–111)
Creatinine, Ser: 0.37 mg/dL — ABNORMAL LOW (ref 0.61–1.24)
GFR calc Af Amer: >60 mL/min (ref >60)
GFR calc non Af Amer: >60 mL/min (ref >60)
Glucose, Bld: 146 mg/dL — ABNORMAL HIGH (ref 70–99)
Potassium: 3.2 mmol/L — ABNORMAL LOW (ref 3.5–5.1)
Sodium: 138 mmol/L (ref 135–145)

## 2019-08-05 NOTE — Progress Notes (Signed)
Pulmonary Critical Care Medicine [facility] [place]   PULMONARY CRITICAL CARE SERVICE  PROGRESS NOTE  Date of Service: 08/05/2019  Caleb Rhodes  [id number]:[id number]  DOB: 01-25-1956   DOA: 07/22/2019  Referring Physician: Carron Curie, MD  HPI: Caleb Rhodes is a 64 y.o. male seen for follow up of Acute on Chronic Respiratory Failure.  Patient is currently on the NAG on 1 L secretions still remain an issue  Medications: Reviewed on Rounds  Physical Exam:  Vitals: Temperature 98.8 pulse 85 respiratory 19 blood pressure is 97/59 saturations 100%  Ventilator Settings on the NAG 1 L oxygen  . General: Comfortable at this time . Eyes: Grossly normal lids, irises & conjunctiva . ENT: grossly tongue is normal . Neck: no obvious mass . Cardiovascular: S1 S2 normal no gallop . Respiratory: No rhonchi no rales . Abdomen: soft . Skin: no rash seen on limited exam . Musculoskeletal: not rigid . Psychiatric:unable to assess . Neurologic: no seizure no involuntary movements         Lab Data:   Basic Metabolic Panel: Recent Labs  Lab 08/01/19 1421 08/05/19 0517  NA 139 138  K 4.9 3.2*  CL 106 102  CO2 20* 27  GLUCOSE 186* 146*  BUN 15 10  CREATININE 0.31* 0.37*  CALCIUM 8.7* 8.9  MG 1.9  --     ABG: No results for input(s): PHART, PCO2ART, PO2ART, HCO3, O2SAT in the last 168 hours.  Liver Function Tests: No results for input(s): AST, ALT, ALKPHOS, BILITOT, PROT, ALBUMIN in the last 168 hours. No results for input(s): LIPASE, AMYLASE in the last 168 hours. No results for input(s): AMMONIA in the last 168 hours.  CBC: Recent Labs  Lab 08/05/19 0517  WBC 8.8  HGB 8.8*  HCT 28.0*  MCV 83.6  PLT 331    Cardiac Enzymes: No results for input(s): CKTOTAL, CKMB, CKMBINDEX, TROPONINI in the last 168 hours.  BNP (last 3 results) No results for input(s): BNP in the last 8760 hours.  ProBNP (last 3 results) No results for input(s): PROBNP in the last 8760  hours.  Radiological Exams: No results found.  Assessment/Plan Active Problems:   Acute on chronic respiratory failure with hypoxia (HCC)   Severe sepsis (HCC)   Multifocal pneumonia   COVID-19 virus infection   Pneumonia due to COVID-19 virus   1. Acute on chronic respiratory failure hypoxia plan is to continue with weaning continue secretion management pulmonary toilet. 2. Severe sepsis resolving hemodynamics are stable 3. Multifocal pneumonia treated we will continue with present management 4. COVID-19 virus infection in resolution phase 5. Pneumonia due to COVID-19 slowly improving she still has residual secretions which are not uncommon to see post Covid   I have personally seen and evaluated the patient, evaluated laboratory and imaging results, formulated the assessment and plan and placed orders. The Patient requires high complexity decision making with multiple systems involvement.  Rounds were done with the Respiratory Therapy Director and Staff therapists and discussed with nursing staff also.  Yevonne Pax, MD [facility] Pulmonary Critical Care Medicine Sleep Medicine

## 2019-08-06 DIAGNOSIS — J189 Pneumonia, unspecified organism: Secondary | ICD-10-CM | POA: Diagnosis not present

## 2019-08-06 DIAGNOSIS — J9621 Acute and chronic respiratory failure with hypoxia: Secondary | ICD-10-CM | POA: Diagnosis not present

## 2019-08-06 DIAGNOSIS — U071 COVID-19: Secondary | ICD-10-CM | POA: Diagnosis not present

## 2019-08-06 DIAGNOSIS — A419 Sepsis, unspecified organism: Secondary | ICD-10-CM | POA: Diagnosis not present

## 2019-08-06 LAB — POTASSIUM: Potassium: 3.7 mmol/L (ref 3.5–5.1)

## 2019-08-06 NOTE — Progress Notes (Addendum)
Pulmonary Critical Care Medicine [facility] [place]   PULMONARY CRITICAL CARE SERVICE  PROGRESS NOTE  Date of Service: 08/06/2019  Caleb Rhodes  [id number]  DOB: [birth year]   DOA: 07/22/2019  Referring Physician: Carron Curie, MD  HPI: Caleb Rhodes is a 64 y.o. male seen for follow up of Acute on Chronic Respiratory Failure.  Patient remains capped for 24 hours at this time however satting well no distress.  Medications: Reviewed on Rounds  Physical Exam:  Vitals: Pulse 84 respirations 23 BP 112/71 O2 sat 97% temp 98.2  Ventilator Settings room air  . General: Comfortable at this time . Eyes: Grossly normal lids, irises & conjunctiva . ENT: grossly tongue is normal . Neck: no obvious mass . Cardiovascular: S1 S2 normal no gallop . Respiratory: No rales or rhonchi noted . Abdomen: soft . Skin: no rash seen on limited exam . Musculoskeletal: not rigid . Psychiatric:unable to assess . Neurologic: no seizure no involuntary movements         Lab Data:   Basic Metabolic Panel: Recent Labs  Lab 08/01/19 1421 08/05/19 0517  NA 139 138  K 4.9 3.2*  CL 106 102  CO2 20* 27  GLUCOSE 186* 146*  BUN 15 10  CREATININE 0.31* 0.37*  CALCIUM 8.7* 8.9  MG 1.9  --     ABG: No results for input(s): PHART, PCO2ART, PO2ART, HCO3, O2SAT in the last 168 hours.  Liver Function Tests: No results for input(s): AST, ALT, ALKPHOS, BILITOT, PROT, ALBUMIN in the last 168 hours. No results for input(s): LIPASE, AMYLASE in the last 168 hours. No results for input(s): AMMONIA in the last 168 hours.  CBC: Recent Labs  Lab 08/05/19 0517  WBC 8.8  HGB 8.8*  HCT 28.0*  MCV 83.6  PLT 331    Cardiac Enzymes: No results for input(s): CKTOTAL, CKMB, CKMBINDEX, TROPONINI in the last 168 hours.  BNP (last 3 results) No results for input(s): BNP in the last 8760 hours.  ProBNP (last 3 results) No results for input(s): PROBNP in the last 8760 hours.  Radiological  Exams: No results found.  Assessment/Plan Active Problems:   Acute on chronic respiratory failure with hypoxia (HCC)   Severe sepsis (HCC)   Multifocal pneumonia   COVID-19 virus infection   Pneumonia due to COVID-19 virus   1. Acute on chronic respiratory failure hypoxia patient is currently on room air has been capped 24 hours continue supportive measures at this time. 2. Severe sepsis resolving hemodynamics are stable 3. Multifocal pneumonia treated we will continue with present management 4. COVID-19 virus infection in resolution phase 5. Pneumonia due to COVID-19 slowly improving she still has residual secretions which are not uncommon to see post Covid   I have personally seen and evaluated the patient, evaluated laboratory and imaging results, formulated the assessment and plan and placed orders. The Patient requires high complexity decision making with multiple systems involvement.  Rounds were done with the Respiratory Therapy Director and Staff therapists and discussed with nursing staff also.  Yevonne Pax, MD [facility] Pulmonary Critical Care Medicine Sleep Medicine

## 2019-08-07 ENCOUNTER — Other Ambulatory Visit (HOSPITAL_COMMUNITY): Payer: Medicare HMO

## 2019-08-07 DIAGNOSIS — A419 Sepsis, unspecified organism: Secondary | ICD-10-CM | POA: Diagnosis not present

## 2019-08-07 DIAGNOSIS — J189 Pneumonia, unspecified organism: Secondary | ICD-10-CM | POA: Diagnosis not present

## 2019-08-07 DIAGNOSIS — U071 COVID-19: Secondary | ICD-10-CM | POA: Diagnosis not present

## 2019-08-07 DIAGNOSIS — J9621 Acute and chronic respiratory failure with hypoxia: Secondary | ICD-10-CM | POA: Diagnosis not present

## 2019-08-07 NOTE — Progress Notes (Addendum)
Pulmonary Critical Care Medicine [facility] [place]   PULMONARY CRITICAL CARE SERVICE  PROGRESS NOTE  Date of Service: 08/07/2019  Caleb Rhodes  [id number]  DOB: 10-17-1955   DOA: 07/22/2019  Referring Physician: Carron Curie, MD  HPI: Caleb Rhodes is a 64 y.o. male seen for follow up of Acute on Chronic Respiratory Failure.  Patient is currently on NAG 2 L satting well no distress.  Medications: Reviewed on Rounds  Physical Exam:  Vitals: Pulse 88 respirations 20 BP 102/64 O2 sat 99% temp 97.8  Ventilator Settings NAG 2 L  . General: Comfortable at this time . Eyes: Grossly normal lids, irises & conjunctiva . ENT: grossly tongue is normal . Neck: no obvious mass . Cardiovascular: S1 S2 normal no gallop . Respiratory: No rales or rhonchi noted . Abdomen: soft . Skin: no rash seen on limited exam . Musculoskeletal: not rigid . Psychiatric:unable to assess . Neurologic: no seizure no involuntary movements         Lab Data:   Basic Metabolic Panel: Recent Labs  Lab 08/01/19 1421 08/05/19 0517 08/06/19 1546  NA 139 138  --   K 4.9 3.2* 3.7  CL 106 102  --   CO2 20* 27  --   GLUCOSE 186* 146*  --   BUN 15 10  --   CREATININE 0.31* 0.37*  --   CALCIUM 8.7* 8.9  --   MG 1.9  --   --     ABG: No results for input(s): PHART, PCO2ART, PO2ART, HCO3, O2SAT in the last 168 hours.  Liver Function Tests: No results for input(s): AST, ALT, ALKPHOS, BILITOT, PROT, ALBUMIN in the last 168 hours. No results for input(s): LIPASE, AMYLASE in the last 168 hours. No results for input(s): AMMONIA in the last 168 hours.  CBC: Recent Labs  Lab 08/05/19 0517  WBC 8.8  HGB 8.8*  HCT 28.0*  MCV 83.6  PLT 331    Cardiac Enzymes: No results for input(s): CKTOTAL, CKMB, CKMBINDEX, TROPONINI in the last 168 hours.  BNP (last 3 results) No results for input(s): BNP in the last 8760 hours.  ProBNP (last 3 results) No results for input(s): PROBNP in the  last 8760 hours.  Radiological Exams: DG Chest Port 1 View  Result Date: 08/07/2019 CLINICAL DATA:  Hypoxia Mia. History of severe sepsis. Pneumonia due to COVID-19. EXAM: PORTABLE CHEST 1 VIEW COMPARISON:  Chest x-ray dated 07/22/2019. FINDINGS: Heart size and mediastinal contours are within normal limits. Tracheostomy tube is appropriately positioned in the midline. Subtle opacity is again seen within the infrahilar RIGHT lower lung, atelectasis versus pneumonia. Lungs are otherwise clear. IMPRESSION: Persistent small RIGHT lower lobe pneumonia versus atelectasis. Lungs otherwise clear. Electronically Signed   By: Bary Richard M.D.   On: 08/07/2019 13:10    Assessment/Plan Active Problems:   Acute on chronic respiratory failure with hypoxia (HCC)   Severe sepsis (HCC)   Multifocal pneumonia   COVID-19 virus infection   Pneumonia due to COVID-19 virus   1. Acute on chronic respiratory failure hypoxia plan is to continue with weaning continue secretion management pulmonary toilet. 2. Severe sepsis resolving hemodynamics are stable 3. Multifocal pneumonia treated we will continue with present management 4. COVID-19 virus infection in resolution phase 5. Pneumonia due to COVID-19 slowly improving she still has residual secretions which are not uncommon to see post Covid   I have personally seen and evaluated the patient, evaluated laboratory and imaging results, formulated the  assessment and plan and placed orders. The Patient requires high complexity decision making with multiple systems involvement.  Rounds were done with the Respiratory Therapy Director and Staff therapists and discussed with nursing staff also.  Allyne Gee, MD [facility] Pulmonary Critical Care Medicine Sleep Medicine

## 2019-08-08 ENCOUNTER — Other Ambulatory Visit (HOSPITAL_COMMUNITY): Payer: Medicare HMO

## 2019-08-08 DIAGNOSIS — J9621 Acute and chronic respiratory failure with hypoxia: Secondary | ICD-10-CM | POA: Diagnosis not present

## 2019-08-08 DIAGNOSIS — J189 Pneumonia, unspecified organism: Secondary | ICD-10-CM | POA: Diagnosis not present

## 2019-08-08 DIAGNOSIS — U071 COVID-19: Secondary | ICD-10-CM | POA: Diagnosis not present

## 2019-08-08 DIAGNOSIS — A419 Sepsis, unspecified organism: Secondary | ICD-10-CM | POA: Diagnosis not present

## 2019-08-08 NOTE — Progress Notes (Addendum)
Pulmonary Critical Care Medicine [facility] [place]   PULMONARY CRITICAL CARE SERVICE  PROGRESS NOTE  Date of Service: 08/08/2019  Caleb Rhodes  [id number]  DOB: 1956-04-24   DOA: 07/22/2019  Referring Physician: Carron Curie, MD  HPI: Caleb Rhodes is a 64 y.o. male seen for follow up of Acute on Chronic Respiratory Failure.  Patient mains on 1 L Satting well with no fever or distress.  Medications: Reviewed on Rounds  Physical Exam:  Vitals: Pulse 106 respirations 32 BP 130/71 O2 sat 100% temp 97.5  Ventilator Settings 1 L nasal cannula  . General: Comfortable at this time . Eyes: Grossly normal lids, irises & conjunctiva . ENT: grossly tongue is normal . Neck: no obvious mass . Cardiovascular: S1 S2 normal no gallop . Respiratory: No rales or rhonchi noted . Abdomen: soft . Skin: no rash seen on limited exam . Musculoskeletal: not rigid . Psychiatric:unable to assess . Neurologic: no seizure no involuntary movements         Lab Data:   Basic Metabolic Panel: Recent Labs  Lab 08/05/19 0517 08/06/19 1546  NA 138  --   K 3.2* 3.7  CL 102  --   CO2 27  --   GLUCOSE 146*  --   BUN 10  --   CREATININE 0.37*  --   CALCIUM 8.9  --     ABG: No results for input(s): PHART, PCO2ART, PO2ART, HCO3, O2SAT in the last 168 hours.  Liver Function Tests: No results for input(s): AST, ALT, ALKPHOS, BILITOT, PROT, ALBUMIN in the last 168 hours. No results for input(s): LIPASE, AMYLASE in the last 168 hours. No results for input(s): AMMONIA in the last 168 hours.  CBC: Recent Labs  Lab 08/05/19 0517  WBC 8.8  HGB 8.8*  HCT 28.0*  MCV 83.6  PLT 331    Cardiac Enzymes: No results for input(s): CKTOTAL, CKMB, CKMBINDEX, TROPONINI in the last 168 hours.  BNP (last 3 results) No results for input(s): BNP in the last 8760 hours.  ProBNP (last 3 results) No results for input(s): PROBNP in the last 8760 hours.  Radiological Exams: DG Abd 1  View  Result Date: 08/08/2019 CLINICAL DATA:  Abdominal discomfort.  Ileus. EXAM: ABDOMEN - 1 VIEW COMPARISON:  08/03/2019 FINDINGS: Peg tube noted. Left lower quadrant clips and right upper abdominal clips are present. Mild prominence of stool in the proximal colon. There continues to be barium type contrast medium in the distal transverse and descending colon. The patient had contrast medium injected via the PEG tube on 07/22/2019, if no further contrast medium has been injected since that time then the persistence of this contrast in large bowel over the last 17 days would certainly support the clinical diagnosis of ileus. Mild lumbar spondylosis.  Mild degenerative findings in the hips. IMPRESSION: 1. Persistence of contrast medium in the distal colon, favoring ileus. Electronically Signed   By: Gaylyn Rong M.D.   On: 08/08/2019 12:37   DG Chest Port 1 View  Result Date: 08/07/2019 CLINICAL DATA:  Hypoxia Mia. History of severe sepsis. Pneumonia due to COVID-19. EXAM: PORTABLE CHEST 1 VIEW COMPARISON:  Chest x-ray dated 07/22/2019. FINDINGS: Heart size and mediastinal contours are within normal limits. Tracheostomy tube is appropriately positioned in the midline. Subtle opacity is again seen within the infrahilar RIGHT lower lung, atelectasis versus pneumonia. Lungs are otherwise clear. IMPRESSION: Persistent small RIGHT lower lobe pneumonia versus atelectasis. Lungs otherwise clear. Electronically Signed   By:  Franki Cabot M.D.   On: 08/07/2019 13:10    Assessment/Plan Active Problems:   Acute on chronic respiratory failure with hypoxia (HCC)   Severe sepsis (HCC)   Multifocal pneumonia   COVID-19 virus infection   Pneumonia due to COVID-19 virus   1. Acute on chronic respiratory failure hypoxia plan is to continue with weaning continue secretion management pulmonary toilet. 2. Severe sepsis resolving hemodynamics are stable 3. Multifocal pneumonia treated we will continue with  present management 4. COVID-19 virus infection in resolution phase 5. Pneumonia due to COVID-19 slowly improving she still has residual secretions which are not uncommon to see post Covid   I have personally seen and evaluated the patient, evaluated laboratory and imaging results, formulated the assessment and plan and placed orders. The Patient requires high complexity decision making with multiple systems involvement.  Rounds were done with the Respiratory Therapy Director and Staff therapists and discussed with nursing staff also.  Allyne Gee, MD [facility] Pulmonary Critical Care Medicine Sleep Medicine

## 2019-08-09 DIAGNOSIS — A419 Sepsis, unspecified organism: Secondary | ICD-10-CM | POA: Diagnosis not present

## 2019-08-09 DIAGNOSIS — J9621 Acute and chronic respiratory failure with hypoxia: Secondary | ICD-10-CM | POA: Diagnosis not present

## 2019-08-09 DIAGNOSIS — U071 COVID-19: Secondary | ICD-10-CM | POA: Diagnosis not present

## 2019-08-09 DIAGNOSIS — J189 Pneumonia, unspecified organism: Secondary | ICD-10-CM | POA: Diagnosis not present

## 2019-08-09 LAB — BASIC METABOLIC PANEL
Anion gap: 11 (ref 5–15)
BUN: 7 mg/dL — ABNORMAL LOW (ref 8–23)
CO2: 25 mmol/L (ref 22–32)
Calcium: 8.6 mg/dL — ABNORMAL LOW (ref 8.9–10.3)
Chloride: 100 mmol/L (ref 98–111)
Creatinine, Ser: 0.38 mg/dL — ABNORMAL LOW (ref 0.61–1.24)
GFR calc Af Amer: >60 mL/min (ref >60)
GFR calc non Af Amer: >60 mL/min (ref >60)
Glucose, Bld: 347 mg/dL — ABNORMAL HIGH (ref 70–99)
Potassium: 3.5 mmol/L (ref 3.5–5.1)
Sodium: 136 mmol/L (ref 135–145)

## 2019-08-09 LAB — CBC
HCT: 28.3 % — ABNORMAL LOW (ref 39.0–52.0)
Hemoglobin: 8.6 g/dL — ABNORMAL LOW (ref 13.0–17.0)
MCH: 26.1 pg (ref 26.0–34.0)
MCHC: 30.4 g/dL (ref 30.0–36.0)
MCV: 86 fL (ref 80.0–100.0)
Platelets: 284 10*3/uL (ref 150–400)
RBC: 3.29 MIL/uL — ABNORMAL LOW (ref 4.22–5.81)
RDW: 20.3 % — ABNORMAL HIGH (ref 11.5–15.5)
WBC: 5.6 10*3/uL (ref 4.0–10.5)
nRBC: 0 % (ref 0.0–0.2)

## 2019-08-09 NOTE — Progress Notes (Signed)
Pulmonary Critical Care Medicine [place]   PULMONARY CRITICAL CARE SERVICE  PROGRESS NOTE  Date of Service: 08/09/2019  Caleb Rhodes  [id number]  DOB: 06/11/1956   DOA: 07/22/2019  Referring Physician: Merton Border, MD  HPI: Caleb Rhodes is a 64 y.o. male seen for follow up of Acute on Chronic Respiratory Failure.  Patient is capping will be capping now for 24 hours seems to be tolerating it well  Medications: Reviewed on Rounds  Physical Exam:  Vitals: Temperature is 96.9 pulse 93 respiratory rate 16 blood pressures 107/68 saturations 100%  Ventilator Settings capping off the ventilator at this time  . General: Comfortable at this time . Eyes: Grossly normal lids, irises & conjunctiva . ENT: grossly tongue is normal . Neck: no obvious mass . Cardiovascular: S1 S2 normal no gallop . Respiratory: No rhonchi no rales are noted . Abdomen: soft . Skin: no rash seen on limited exam . Musculoskeletal: not rigid . Psychiatric:unable to assess . Neurologic: no seizure no involuntary movements         Lab Data:   Basic Metabolic Panel: Recent Labs  Lab 08/05/19 0517 08/06/19 1546 08/09/19 0744  NA 138  --  136  K 3.2* 3.7 3.5  CL 102  --  100  CO2 27  --  25  GLUCOSE 146*  --  347*  BUN 10  --  7*  CREATININE 0.37*  --  0.38*  CALCIUM 8.9  --  8.6*    ABG: No results for input(s): PHART, PCO2ART, PO2ART, HCO3, O2SAT in the last 168 hours.  Liver Function Tests: No results for input(s): AST, ALT, ALKPHOS, BILITOT, PROT, ALBUMIN in the last 168 hours. No results for input(s): LIPASE, AMYLASE in the last 168 hours. No results for input(s): AMMONIA in the last 168 hours.  CBC: Recent Labs  Lab 08/05/19 0517 08/09/19 0744  WBC 8.8 5.6  HGB 8.8* 8.6*  HCT 28.0* 28.3*  MCV 83.6 86.0  PLT 331 284    Cardiac Enzymes: No results for input(s): CKTOTAL, CKMB, CKMBINDEX, TROPONINI in the last 168 hours.  BNP (last 3 results) No  results for input(s): BNP in the last 8760 hours.  ProBNP (last 3 results) No results for input(s): PROBNP in the last 8760 hours.  Radiological Exams: DG Abd 1 View  Result Date: 08/08/2019 CLINICAL DATA:  Abdominal discomfort.  Ileus. EXAM: ABDOMEN - 1 VIEW COMPARISON:  08/03/2019 FINDINGS: Peg tube noted. Left lower quadrant clips and right upper abdominal clips are present. Mild prominence of stool in the proximal colon. There continues to be barium type contrast medium in the distal transverse and descending colon. The patient had contrast medium injected via the PEG tube on 07/22/2019, if no further contrast medium has been injected since that time then the persistence of this contrast in large bowel over the last 17 days would certainly support the clinical diagnosis of ileus. Mild lumbar spondylosis.  Mild degenerative findings in the hips. IMPRESSION: 1. Persistence of contrast medium in the distal colon, favoring ileus. Electronically Signed   By: Van Clines M.D.   On: 08/08/2019 12:37    Assessment/Plan Active Problems:   Acute on chronic respiratory failure with hypoxia (HCC)   Severe sepsis (HCC)   Multifocal pneumonia   COVID-19 virus infection   Pneumonia due to COVID-19 virus   1. Acute on chronic respiratory failure with hypoxia plan is to continue with capping trials as ordered titrate oxygen continue pulmonary toilet  2. Multifocal pneumonia treated improving slowly 3. COVID-19 virus infection in resolution phase 4. COVID-19 pneumonia still persistent changes on the x-ray we will continue to follow along closely 5. Severe sepsis this has resolved hemodynamics are stable at this time   I have personally seen and evaluated the patient, evaluated laboratory and imaging results, formulated the assessment and plan and placed orders. The Patient requires high complexity decision making with multiple systems involvement.  Rounds were done with the Respiratory Therapy  Director and Staff therapists and discussed with nursing staff also.  Yevonne Pax, MD [facility] Pulmonary Critical Care Medicine Sleep Medicine

## 2019-08-10 ENCOUNTER — Other Ambulatory Visit (HOSPITAL_COMMUNITY): Payer: Medicare HMO

## 2019-08-10 DIAGNOSIS — J9621 Acute and chronic respiratory failure with hypoxia: Secondary | ICD-10-CM | POA: Diagnosis not present

## 2019-08-10 DIAGNOSIS — J189 Pneumonia, unspecified organism: Secondary | ICD-10-CM | POA: Diagnosis not present

## 2019-08-10 DIAGNOSIS — U071 COVID-19: Secondary | ICD-10-CM | POA: Diagnosis not present

## 2019-08-10 DIAGNOSIS — A419 Sepsis, unspecified organism: Secondary | ICD-10-CM | POA: Diagnosis not present

## 2019-08-10 NOTE — Progress Notes (Signed)
Pulmonary Critical Care Medicine [facility] [place]   PULMONARY CRITICAL CARE SERVICE  PROGRESS NOTE  Date of Service: 08/10/2019  Caleb Rhodes  [id number]  DOB: 08-31-1955   DOA: 07/22/2019  Referring Physician: Carron Curie, MD  HPI: Caleb Rhodes is a 64 y.o. male seen for follow up of Acute on Chronic Respiratory Failure.  Main issue with the patient right now is the secretions patient has been capping but has required suctioning  Medications: Reviewed on Rounds  Physical Exam:  Vitals: Temperature 96.4 pulse 90 respiratory 14 blood pressure is 119/78 saturations 94%  Ventilator Settings capping right now on room air  . General: Comfortable at this time . Eyes: Grossly normal lids, irises & conjunctiva . ENT: grossly tongue is normal . Neck: no obvious mass . Cardiovascular: S1 S2 normal no gallop . Respiratory: No rhonchi no rales are noted . Abdomen: soft . Skin: no rash seen on limited exam . Musculoskeletal: not rigid . Psychiatric:unable to assess . Neurologic: no seizure no involuntary movements         Lab Data:   Basic Metabolic Panel: Recent Labs  Lab 08/05/19 0517 08/06/19 1546 08/09/19 0744  NA 138  --  136  K 3.2* 3.7 3.5  CL 102  --  100  CO2 27  --  25  GLUCOSE 146*  --  347*  BUN 10  --  7*  CREATININE 0.37*  --  0.38*  CALCIUM 8.9  --  8.6*    ABG: No results for input(s): PHART, PCO2ART, PO2ART, HCO3, O2SAT in the last 168 hours.  Liver Function Tests: No results for input(s): AST, ALT, ALKPHOS, BILITOT, PROT, ALBUMIN in the last 168 hours. No results for input(s): LIPASE, AMYLASE in the last 168 hours. No results for input(s): AMMONIA in the last 168 hours.  CBC: Recent Labs  Lab 08/05/19 0517 08/09/19 0744  WBC 8.8 5.6  HGB 8.8* 8.6*  HCT 28.0* 28.3*  MCV 83.6 86.0  PLT 331 284    Cardiac Enzymes: No results for input(s): CKTOTAL, CKMB, CKMBINDEX, TROPONINI in the last 168 hours.  BNP (last 3  results) No results for input(s): BNP in the last 8760 hours.  ProBNP (last 3 results) No results for input(s): PROBNP in the last 8760 hours.  Radiological Exams: DG Abd Portable 1V  Result Date: 08/10/2019 CLINICAL DATA:  Ileus. EXAM: PORTABLE ABDOMEN - 1 VIEW COMPARISON:  Radiograph 08/08/2019 FINDINGS: Gastrostomy tube in the left upper quadrant. Previous contrast in the colon has cleared. Air-filled nondilated loops of small bowel in the right abdomen. Calcifications in the left abdomen likely nonobstructing renal stones. Surgical clips in the lower abdomen. No evidence of free air. IMPRESSION: 1. Previous contrast in the colon has cleared. 2. Air-filled loops of nondilated small bowel primarily in the right abdomen, may represent ileus. No evidence of obstruction. Electronically Signed   By: Narda Rutherford M.D.   On: 08/10/2019 06:46    Assessment/Plan Active Problems:   Acute on chronic respiratory failure with hypoxia (HCC)   Severe sepsis (HCC)   Multifocal pneumonia   COVID-19 virus infection   Pneumonia due to COVID-19 virus   1. Acute on chronic respiratory failure with hypoxia continue with capping trials on room air continue aggressive pulmonary toilet 2. Severe sepsis resolved hemodynamics are stable 3. Multifocal pneumonia treated 4. COVID-19 virus infection patient still has significant secretions.  Plan is to continue with aggressive pulmonary toilet supportive care hold off on decannulation 5.  Pneumonia due to COVID-19 we will continue to monitor continue with oxygen as needed right now is on room air   I have personally seen and evaluated the patient, evaluated laboratory and imaging results, formulated the assessment and plan and placed orders. The Patient requires high complexity decision making with multiple systems involvement.  Rounds were done with the Respiratory Therapy Director and Staff therapists and discussed with nursing staff also.  Allyne Gee,  MD [facility] Pulmonary Critical Care Medicine Sleep Medicine

## 2019-08-11 ENCOUNTER — Other Ambulatory Visit (HOSPITAL_COMMUNITY): Payer: Medicare HMO

## 2019-08-11 DIAGNOSIS — U071 COVID-19: Secondary | ICD-10-CM | POA: Diagnosis not present

## 2019-08-11 DIAGNOSIS — A419 Sepsis, unspecified organism: Secondary | ICD-10-CM | POA: Diagnosis not present

## 2019-08-11 DIAGNOSIS — J189 Pneumonia, unspecified organism: Secondary | ICD-10-CM | POA: Diagnosis not present

## 2019-08-11 DIAGNOSIS — J9621 Acute and chronic respiratory failure with hypoxia: Secondary | ICD-10-CM | POA: Diagnosis not present

## 2019-08-11 NOTE — Progress Notes (Signed)
Pulmonary Critical Care Medicine [facility] [place]   PULMONARY CRITICAL CARE SERVICE  PROGRESS NOTE  Date of Service: 08/11/2019  Caleb Rhodes  WCB:[id number]  DOB: 01/21/1956   DOA: 07/22/2019  Referring Physician: Carron Curie, MD  HPI: Caleb Rhodes is a 64 y.o. male seen for follow up of Acute on Chronic Respiratory Failure.  Patient is capping still has significant secretions as noted during rounds.  Patient has now been capping for 72 hours.  We did discuss possibility of using inhalers to help improve and mobilize his secretions so respiratory therapy is going to look into getting those from pharmacy  Medications: Reviewed on Rounds  Physical Exam:  Vitals: Temperature is 97.6 pulse 88 respiratory rate 13 blood pressure is 102/67 saturations 96%  Ventilator Settings capping off the ventilator right now in 72 hours  . General: Comfortable at this time . Eyes: Grossly normal lids, irises & conjunctiva . ENT: grossly tongue is normal . Neck: no obvious mass . Cardiovascular: S1 S2 normal no gallop . Respiratory: No rhonchi no rales are noted at this time . Abdomen: soft . Skin: no rash seen on limited exam . Musculoskeletal: not rigid . Psychiatric:unable to assess . Neurologic: no seizure no involuntary movements         Lab Data:   Basic Metabolic Panel: Recent Labs  Lab 08/05/19 0517 08/06/19 1546 08/09/19 0744  NA 138  --  136  K 3.2* 3.7 3.5  CL 102  --  100  CO2 27  --  25  GLUCOSE 146*  --  347*  BUN 10  --  7*  CREATININE 0.37*  --  0.38*  CALCIUM 8.9  --  8.6*    ABG: No results for input(s): PHART, PCO2ART, PO2ART, HCO3, O2SAT in the last 168 hours.  Liver Function Tests: No results for input(s): AST, ALT, ALKPHOS, BILITOT, PROT, ALBUMIN in the last 168 hours. No results for input(s): LIPASE, AMYLASE in the last 168 hours. No results for input(s): AMMONIA in the last 168 hours.  CBC: Recent Labs  Lab 08/05/19 0517  08/09/19 0744  WBC 8.8 5.6  HGB 8.8* 8.6*  HCT 28.0* 28.3*  MCV 83.6 86.0  PLT 331 284    Cardiac Enzymes: No results for input(s): CKTOTAL, CKMB, CKMBINDEX, TROPONINI in the last 168 hours.  BNP (last 3 results) No results for input(s): BNP in the last 8760 hours.  ProBNP (last 3 results) No results for input(s): PROBNP in the last 8760 hours.  Radiological Exams: DG Abd Portable 1V  Result Date: 08/11/2019 CLINICAL DATA:  Follow-up ileus. EXAM: PORTABLE ABDOMEN - 1 VIEW COMPARISON:  08/10/2019 FINDINGS: Feeding gastrostomy tube remains in unchanged position in the left upper quadrant. Persistent scattered air throughout the small bowel and colon suggesting an ileus. No definite free air. IMPRESSION: Persistent ileus bowel gas pattern. Electronically Signed   By: Rudie Meyer M.D.   On: 08/11/2019 08:07   DG Abd Portable 1V  Result Date: 08/10/2019 CLINICAL DATA:  Ileus. EXAM: PORTABLE ABDOMEN - 1 VIEW COMPARISON:  Radiograph 08/08/2019 FINDINGS: Gastrostomy tube in the left upper quadrant. Previous contrast in the colon has cleared. Air-filled nondilated loops of small bowel in the right abdomen. Calcifications in the left abdomen likely nonobstructing renal stones. Surgical clips in the lower abdomen. No evidence of free air. IMPRESSION: 1. Previous contrast in the colon has cleared. 2. Air-filled loops of nondilated small bowel primarily in the right abdomen, may represent ileus. No evidence  of obstruction. Electronically Signed   By: Keith Rake M.D.   On: 08/10/2019 06:46    Assessment/Plan Active Problems:   Acute on chronic respiratory failure with hypoxia (HCC)   Severe sepsis (HCC)   Multifocal pneumonia   COVID-19 virus infection   Pneumonia due to COVID-19 virus   1. Acute on chronic respiratory failure with hypoxia plan is to continue with capping for now has been requiring on capping for suctioning holding off on decannulation 2. Multifocal pneumonia treated  clinically improving 3. COVID-19 virus infection in resolution phase 4. Severe sepsis resolved 5. Pneumonia due to COVID-19 clinically is improving we will continue to monitor   I have personally seen and evaluated the patient, evaluated laboratory and imaging results, formulated the assessment and plan and placed orders. The Patient requires high complexity decision making with multiple systems involvement.  Rounds were done with the Respiratory Therapy Director and Staff therapists and discussed with nursing staff also.  Allyne Gee, MD [facility] Pulmonary Critical Care Medicine Sleep Medicine

## 2019-08-12 DIAGNOSIS — U071 COVID-19: Secondary | ICD-10-CM | POA: Diagnosis not present

## 2019-08-12 DIAGNOSIS — J189 Pneumonia, unspecified organism: Secondary | ICD-10-CM | POA: Diagnosis not present

## 2019-08-12 DIAGNOSIS — J9621 Acute and chronic respiratory failure with hypoxia: Secondary | ICD-10-CM | POA: Diagnosis not present

## 2019-08-12 DIAGNOSIS — A419 Sepsis, unspecified organism: Secondary | ICD-10-CM | POA: Diagnosis not present

## 2019-08-12 NOTE — Progress Notes (Signed)
Pulmonary Critical Care Medicine [facility] [place]   PULMONARY CRITICAL CARE SERVICE  PROGRESS NOTE  Date of Service: 08/12/2019  Caleb Rhodes  [id number]  DOB: [birth year]   DOA: 07/22/2019  Referring Physician: Carron Curie, MD  HPI: Caleb Rhodes is a 64 y.o. male seen for follow up of Acute on Chronic Respiratory Failure.  Patient is capping right now seems to be doing well.  Still however does not have a good strong cough which is worrisome and also there appears to be a neurological disconnect and cognitive disconnect  Medications: Reviewed on Rounds  Physical Exam:  Vitals: Temperature is 97.5 pulse 78 respiratory 12 blood pressure is 97/61 saturations 97%  Ventilator Settings capping right now off the ventilator  . General: Comfortable at this time . Eyes: Grossly normal lids, irises & conjunctiva . ENT: grossly tongue is normal . Neck: no obvious mass . Cardiovascular: S1 S2 normal no gallop . Respiratory: Scattered coarse breath sounds are noted . Abdomen: soft . Skin: no rash seen on limited exam . Musculoskeletal: not rigid . Psychiatric:unable to assess . Neurologic: no seizure no involuntary movements         Lab Data:   Basic Metabolic Panel: Recent Labs  Lab 08/06/19 1546 08/09/19 0744  NA  --  136  K 3.7 3.5  CL  --  100  CO2  --  25  GLUCOSE  --  347*  BUN  --  7*  CREATININE  --  0.38*  CALCIUM  --  8.6*    ABG: No results for input(s): PHART, PCO2ART, PO2ART, HCO3, O2SAT in the last 168 hours.  Liver Function Tests: No results for input(s): AST, ALT, ALKPHOS, BILITOT, PROT, ALBUMIN in the last 168 hours. No results for input(s): LIPASE, AMYLASE in the last 168 hours. No results for input(s): AMMONIA in the last 168 hours.  CBC: Recent Labs  Lab 08/09/19 0744  WBC 5.6  HGB 8.6*  HCT 28.3*  MCV 86.0  PLT 284    Cardiac Enzymes: No results for input(s): CKTOTAL, CKMB, CKMBINDEX, TROPONINI in the last 168  hours.  BNP (last 3 results) No results for input(s): BNP in the last 8760 hours.  ProBNP (last 3 results) No results for input(s): PROBNP in the last 8760 hours.  Radiological Exams: DG Abd Portable 1V  Result Date: 08/11/2019 CLINICAL DATA:  Follow-up ileus. EXAM: PORTABLE ABDOMEN - 1 VIEW COMPARISON:  08/10/2019 FINDINGS: Feeding gastrostomy tube remains in unchanged position in the left upper quadrant. Persistent scattered air throughout the small bowel and colon suggesting an ileus. No definite free air. IMPRESSION: Persistent ileus bowel gas pattern. Electronically Signed   By: Rudie Meyer M.D.   On: 08/11/2019 08:07    Assessment/Plan Active Problems:   Acute on chronic respiratory failure with hypoxia (HCC)   Severe sepsis (HCC)   Multifocal pneumonia   COVID-19 virus infection   Pneumonia due to COVID-19 virus   1. Acute on chronic respiratory failure with hypoxia plan is to continue with capping trials patient however does not have a good spontaneous cough and respiratory therapy reports that intermittently he is having to be suctioned 2. Severe sepsis this has resolved 3. Multifocal pneumonia clinically improving 4. COVID-19 virus infection at baseline 5. Pneumonia due to COVID-19 treated clinically is improving   I have personally seen and evaluated the patient, evaluated laboratory and imaging results, formulated the assessment and plan and placed orders. The Patient requires high complexity decision making  with multiple systems involvement.  Rounds were done with the Respiratory Therapy Director and Staff therapists and discussed with nursing staff also.  Allyne Gee, MD [facility] Pulmonary Critical Care Medicine Sleep Medicine

## 2019-08-13 ENCOUNTER — Other Ambulatory Visit (HOSPITAL_COMMUNITY): Payer: Medicare HMO

## 2019-08-13 DIAGNOSIS — J9621 Acute and chronic respiratory failure with hypoxia: Secondary | ICD-10-CM | POA: Diagnosis not present

## 2019-08-13 DIAGNOSIS — A419 Sepsis, unspecified organism: Secondary | ICD-10-CM | POA: Diagnosis not present

## 2019-08-13 DIAGNOSIS — U071 COVID-19: Secondary | ICD-10-CM | POA: Diagnosis not present

## 2019-08-13 DIAGNOSIS — J189 Pneumonia, unspecified organism: Secondary | ICD-10-CM | POA: Diagnosis not present

## 2019-08-13 NOTE — Progress Notes (Signed)
Pulmonary Critical Care Medicine [facility] [place]   PULMONARY CRITICAL CARE SERVICE  PROGRESS NOTE  Date of Service: 08/13/2019  Caleb Rhodes  [id number]:[id number]  DOB: 08/30/1955   DOA: 07/22/2019  Referring Physician: Carron Curie, MD  HPI: Caleb Rhodes is a 64 y.o. male seen for follow up of Acute on Chronic Respiratory Failure.  Patient is capping doing relatively well has been on room air without any distress  Medications: Reviewed on Rounds  Physical Exam:  Vitals: Temperature is 98.1 pulse 101 respiratory 23 blood pressure is 102/68 saturations 96%  Ventilator Settings capping right now on room air  . General: Comfortable at this time . Eyes: Grossly normal lids, irises & conjunctiva . ENT: grossly tongue is normal . Neck: no obvious mass . Cardiovascular: S1 S2 normal no gallop . Respiratory: No rhonchi coarse breath sounds . Abdomen: soft . Skin: no rash seen on limited exam . Musculoskeletal: not rigid . Psychiatric:unable to assess . Neurologic: no seizure no involuntary movements         Lab Data:   Basic Metabolic Panel: Recent Labs  Lab 08/06/19 1546 08/09/19 0744  NA  --  136  K 3.7 3.5  CL  --  100  CO2  --  25  GLUCOSE  --  347*  BUN  --  7*  CREATININE  --  0.38*  CALCIUM  --  8.6*    ABG: No results for input(s): PHART, PCO2ART, PO2ART, HCO3, O2SAT in the last 168 hours.  Liver Function Tests: No results for input(s): AST, ALT, ALKPHOS, BILITOT, PROT, ALBUMIN in the last 168 hours. No results for input(s): LIPASE, AMYLASE in the last 168 hours. No results for input(s): AMMONIA in the last 168 hours.  CBC: Recent Labs  Lab 08/09/19 0744  WBC 5.6  HGB 8.6*  HCT 28.3*  MCV 86.0  PLT 284    Cardiac Enzymes: No results for input(s): CKTOTAL, CKMB, CKMBINDEX, TROPONINI in the last 168 hours.  BNP (last 3 results) No results for input(s): BNP in the last 8760 hours.  ProBNP (last 3 results) No results for input(s):  PROBNP in the last 8760 hours.  Radiological Exams: DG Abd Portable 1V  Result Date: 08/13/2019 CLINICAL DATA:  Ileus. EXAM: PORTABLE ABDOMEN - 1 VIEW COMPARISON:  Radiograph 08/11/2019 FINDINGS: Gastrostomy tube in the left upper quadrant. Decreased small bowel gas from prior exam. Prominent air-filled colon again seen. Stool distends the rectum. Surgical clips in the left abdomen. No evidence of obstruction or free air. IMPRESSION: Decreasing small gas with persistent air-filled colon, may represent colonic ileus. No obstruction. Electronically Signed   By: Narda Rutherford M.D.   On: 08/13/2019 06:42    Assessment/Plan Active Problems:   Acute on chronic respiratory failure with hypoxia (HCC)   Severe sepsis (HCC)   Multifocal pneumonia   COVID-19 virus infection   Pneumonia due to COVID-19 virus   1. Acute on chronic respiratory failure with hypoxia plan is to continue with capping trials as tolerated patient secretions are still limiting factor as far as being able to decannulate 2. Severe sepsis this has resolved 3. Multifocal pneumonia treated with still residual changes secondary to COVID-19 4. COVID-19 virus infection in resolution phase 5. Pneumonia due to COVID-19 patient still has significant increase in secretions as has been noted with certain patients with COVID-19 needs ongoing pulmonary toilet   I have personally seen and evaluated the patient, evaluated laboratory and imaging results, formulated the assessment and  plan and placed orders. The Patient requires high complexity decision making with multiple systems involvement.  Rounds were done with the Respiratory Therapy Director and Staff therapists and discussed with nursing staff also.  Allyne Gee, MD [facility] Pulmonary Critical Care Medicine Sleep Medicine

## 2019-08-14 DIAGNOSIS — A419 Sepsis, unspecified organism: Secondary | ICD-10-CM | POA: Diagnosis not present

## 2019-08-14 DIAGNOSIS — J9621 Acute and chronic respiratory failure with hypoxia: Secondary | ICD-10-CM | POA: Diagnosis not present

## 2019-08-14 DIAGNOSIS — U071 COVID-19: Secondary | ICD-10-CM | POA: Diagnosis not present

## 2019-08-14 DIAGNOSIS — J189 Pneumonia, unspecified organism: Secondary | ICD-10-CM | POA: Diagnosis not present

## 2019-08-14 NOTE — Progress Notes (Addendum)
Pulmonary Critical Care Medicine [facility] [place]   PULMONARY CRITICAL CARE SERVICE  PROGRESS NOTE  Date of Service: 08/14/2019  TAYON PAREKH  LXB:[id number]  DOB: Mar 07, 1956   DOA: 07/22/2019  Referring Physician: Carron Curie, MD  HPI: BENOIT MEECH is a 64 y.o. male seen for follow up of Acute on Chronic Respiratory Failure.  Patient continues to be capped on room air with moderate thick secretions noted at this time.  Medications: Reviewed on Rounds  Physical Exam:  Vitals: Pulse 91 respirations 18 BP 118/70 O2 sat 98% temp 97.1  Ventilator Settings room air  . General: Comfortable at this time . Eyes: Grossly normal lids, irises & conjunctiva . ENT: grossly tongue is normal . Neck: no obvious mass . Cardiovascular: S1 S2 normal no gallop . Respiratory: No rales or rhonchi noted . Abdomen: soft . Skin: no rash seen on limited exam . Musculoskeletal: not rigid . Psychiatric:unable to assess . Neurologic: no seizure no involuntary movements         Lab Data:   Basic Metabolic Panel: Recent Labs  Lab 08/09/19 0744  NA 136  K 3.5  CL 100  CO2 25  GLUCOSE 347*  BUN 7*  CREATININE 0.38*  CALCIUM 8.6*    ABG: No results for input(s): PHART, PCO2ART, PO2ART, HCO3, O2SAT in the last 168 hours.  Liver Function Tests: No results for input(s): AST, ALT, ALKPHOS, BILITOT, PROT, ALBUMIN in the last 168 hours. No results for input(s): LIPASE, AMYLASE in the last 168 hours. No results for input(s): AMMONIA in the last 168 hours.  CBC: Recent Labs  Lab 08/09/19 0744  WBC 5.6  HGB 8.6*  HCT 28.3*  MCV 86.0  PLT 284    Cardiac Enzymes: No results for input(s): CKTOTAL, CKMB, CKMBINDEX, TROPONINI in the last 168 hours.  BNP (last 3 results) No results for input(s): BNP in the last 8760 hours.  ProBNP (last 3 results) No results for input(s): PROBNP in the last 8760 hours.  Radiological Exams: DG Abd Portable 1V  Result Date:  08/13/2019 CLINICAL DATA:  Ileus. EXAM: PORTABLE ABDOMEN - 1 VIEW COMPARISON:  Radiograph 08/11/2019 FINDINGS: Gastrostomy tube in the left upper quadrant. Decreased small bowel gas from prior exam. Prominent air-filled colon again seen. Stool distends the rectum. Surgical clips in the left abdomen. No evidence of obstruction or free air. IMPRESSION: Decreasing small gas with persistent air-filled colon, may represent colonic ileus. No obstruction. Electronically Signed   By: Narda Rutherford M.D.   On: 08/13/2019 06:42    Assessment/Plan Active Problems:   Acute on chronic respiratory failure with hypoxia (HCC)   Severe sepsis (HCC)   Multifocal pneumonia   COVID-19 virus infection   Pneumonia due to COVID-19 virus   1. Acute on chronic respiratory failure with hypoxia plan is to continue with capping trials as tolerated patient secretions are still limiting factor as far as being able to decannulate 2. Severe sepsis this has resolved 3. Multifocal pneumonia treated with still residual changes secondary to COVID-19 4. COVID-19 virus infection in resolution phase 5. Pneumonia due to COVID-19 patient still has significant increase in secretions as has been noted with certain patients with COVID-19 needs ongoing pulmonary toilet   I have personally seen and evaluated the patient, evaluated laboratory and imaging results, formulated the assessment and plan and placed orders. The Patient requires high complexity decision making with multiple systems involvement.  Rounds were done with the Respiratory Therapy Director and Staff therapists and  discussed with nursing staff also.  Allyne Gee, MD [facility] Pulmonary Critical Care Medicine Sleep Medicine

## 2019-08-15 DIAGNOSIS — J9621 Acute and chronic respiratory failure with hypoxia: Secondary | ICD-10-CM | POA: Diagnosis not present

## 2019-08-15 DIAGNOSIS — A419 Sepsis, unspecified organism: Secondary | ICD-10-CM | POA: Diagnosis not present

## 2019-08-15 DIAGNOSIS — J189 Pneumonia, unspecified organism: Secondary | ICD-10-CM | POA: Diagnosis not present

## 2019-08-15 DIAGNOSIS — U071 COVID-19: Secondary | ICD-10-CM | POA: Diagnosis not present

## 2019-08-15 NOTE — Progress Notes (Addendum)
Pulmonary Critical Care Medicine [facility] [place]   PULMONARY CRITICAL CARE SERVICE  PROGRESS NOTE  Date of Service: 08/15/2019  Caleb Rhodes  UXN:[id number]  DOB: [birth year]   DOA: 07/22/2019  Referring Physician: Carron Curie, MD  HPI: Caleb Rhodes is a 64 y.o. male seen for follow up of Acute on Chronic Respiratory Failure.  Patient is to be capped on room air satting well no fever distress.  Medications: Reviewed on Rounds  Physical Exam:  Vitals: Pulse 86 respirations 18 BP 98/61 O2 sat 98% temp 98.1  Ventilator Settings room air  . General: Comfortable at this time . Eyes: Grossly normal lids, irises & conjunctiva . ENT: grossly tongue is normal . Neck: no obvious mass . Cardiovascular: S1 S2 normal no gallop . Respiratory: No rales or rhonchi noted . Abdomen: soft . Skin: no rash seen on limited exam . Musculoskeletal: not rigid . Psychiatric:unable to assess . Neurologic: no seizure no involuntary movements         Lab Data:   Basic Metabolic Panel: Recent Labs  Lab 08/09/19 0744  NA 136  K 3.5  CL 100  CO2 25  GLUCOSE 347*  BUN 7*  CREATININE 0.38*  CALCIUM 8.6*    ABG: No results for input(s): PHART, PCO2ART, PO2ART, HCO3, O2SAT in the last 168 hours.  Liver Function Tests: No results for input(s): AST, ALT, ALKPHOS, BILITOT, PROT, ALBUMIN in the last 168 hours. No results for input(s): LIPASE, AMYLASE in the last 168 hours. No results for input(s): AMMONIA in the last 168 hours.  CBC: Recent Labs  Lab 08/09/19 0744  WBC 5.6  HGB 8.6*  HCT 28.3*  MCV 86.0  PLT 284    Cardiac Enzymes: No results for input(s): CKTOTAL, CKMB, CKMBINDEX, TROPONINI in the last 168 hours.  BNP (last 3 results) No results for input(s): BNP in the last 8760 hours.  ProBNP (last 3 results) No results for input(s): PROBNP in the last 8760 hours.  Radiological Exams: No results found.  Assessment/Plan Active Problems:   Acute on  chronic respiratory failure with hypoxia (HCC)   Severe sepsis (HCC)   Multifocal pneumonia   COVID-19 virus infection   Pneumonia due to COVID-19 virus   1. Acute on chronic respiratory failure with hypoxia plan is to continue with capping trials as tolerated patient secretions are still limiting factor as far as being able to decannulate 2. Severe sepsis this has resolved 3. Multifocal pneumonia treated with still residual changes secondary to COVID-19 4. COVID-19 virus infection in resolution phase 5. Pneumonia due to COVID-19 patient still has significant increase in secretions as has been noted with certain patients with COVID-19 needs ongoing pulmonary toilet   I have personally seen and evaluated the patient, evaluated laboratory and imaging results, formulated the assessment and plan and placed orders. The Patient requires high complexity decision making with multiple systems involvement.  Rounds were done with the Respiratory Therapy Director and Staff therapists and discussed with nursing staff also.  Yevonne Pax, MD [facility] Pulmonary Critical Care Medicine Sleep Medicine

## 2019-08-16 DIAGNOSIS — A419 Sepsis, unspecified organism: Secondary | ICD-10-CM | POA: Diagnosis not present

## 2019-08-16 DIAGNOSIS — J189 Pneumonia, unspecified organism: Secondary | ICD-10-CM | POA: Diagnosis not present

## 2019-08-16 DIAGNOSIS — J9621 Acute and chronic respiratory failure with hypoxia: Secondary | ICD-10-CM | POA: Diagnosis not present

## 2019-08-16 DIAGNOSIS — U071 COVID-19: Secondary | ICD-10-CM | POA: Diagnosis not present

## 2019-08-16 NOTE — Progress Notes (Signed)
Pulmonary Critical Care Medicine [facility] [place]   PULMONARY CRITICAL CARE SERVICE  PROGRESS NOTE  Date of Service: 08/16/2019  Caleb Rhodes  [id number]:[id number]  DOB: 24-Dec-1955   DOA: 07/22/2019  Referring Physician: Carron Curie, MD  HPI: Caleb Rhodes is a 64 y.o. male seen for follow up of Acute on Chronic Respiratory Failure.  Patient is capping doing fairly well right now good saturations are noted.  Awaiting discharge to rehab  Medications: Reviewed on Rounds  Physical Exam:  Vitals: Temperature is 97.6 pulse 118 respiratory 20 blood pressure is 109/70 saturations 96%  Ventilator Settings capping off the ventilator  . General: Comfortable at this time . Eyes: Grossly normal lids, irises & conjunctiva . ENT: grossly tongue is normal . Neck: no obvious mass . Cardiovascular: S1 S2 normal no gallop . Respiratory: No rhonchi no rales are noted at this time . Abdomen: soft . Skin: no rash seen on limited exam . Musculoskeletal: not rigid . Psychiatric:unable to assess . Neurologic: no seizure no involuntary movements         Lab Data:   Basic Metabolic Panel: No results for input(s): NA, K, CL, CO2, GLUCOSE, BUN, CREATININE, CALCIUM, MG, PHOS in the last 168 hours.  ABG: No results for input(s): PHART, PCO2ART, PO2ART, HCO3, O2SAT in the last 168 hours.  Liver Function Tests: No results for input(s): AST, ALT, ALKPHOS, BILITOT, PROT, ALBUMIN in the last 168 hours. No results for input(s): LIPASE, AMYLASE in the last 168 hours. No results for input(s): AMMONIA in the last 168 hours.  CBC: No results for input(s): WBC, NEUTROABS, HGB, HCT, MCV, PLT in the last 168 hours.  Cardiac Enzymes: No results for input(s): CKTOTAL, CKMB, CKMBINDEX, TROPONINI in the last 168 hours.  BNP (last 3 results) No results for input(s): BNP in the last 8760 hours.  ProBNP (last 3 results) No results for input(s): PROBNP in the last 8760 hours.  Radiological  Exams: No results found.  Assessment/Plan Active Problems:   Acute on chronic respiratory failure with hypoxia (HCC)   Severe sepsis (HCC)   Multifocal pneumonia   COVID-19 virus infection   Pneumonia due to COVID-19 virus   1. Acute on chronic respiratory failure with hypoxia continue with capping working towards decannulation which if needed can also be done at the rehab facility 2. Severe sepsis right now hemodynamics are stable 3. Multifocal pneumonia treated 4. COVID-19 virus infection at baseline resolved 5. Pneumonia due to COVID-19 improving   I have personally seen and evaluated the patient, evaluated laboratory and imaging results, formulated the assessment and plan and placed orders. The Patient requires high complexity decision making with multiple systems involvement.  Rounds were done with the Respiratory Therapy Director and Staff therapists and discussed with nursing staff also.  Caleb Pax, MD [facility] Pulmonary Critical Care Medicine Sleep Medicine

## 2019-08-17 DIAGNOSIS — A419 Sepsis, unspecified organism: Secondary | ICD-10-CM | POA: Diagnosis not present

## 2019-08-17 DIAGNOSIS — J189 Pneumonia, unspecified organism: Secondary | ICD-10-CM | POA: Diagnosis not present

## 2019-08-17 DIAGNOSIS — J9621 Acute and chronic respiratory failure with hypoxia: Secondary | ICD-10-CM | POA: Diagnosis not present

## 2019-08-17 DIAGNOSIS — U071 COVID-19: Secondary | ICD-10-CM | POA: Diagnosis not present

## 2019-08-17 LAB — CBC
HCT: 34.4 % — ABNORMAL LOW (ref 39.0–52.0)
Hemoglobin: 10.7 g/dL — ABNORMAL LOW (ref 13.0–17.0)
MCH: 27.4 pg (ref 26.0–34.0)
MCHC: 31.1 g/dL (ref 30.0–36.0)
MCV: 88 fL (ref 80.0–100.0)
Platelets: 346 10*3/uL (ref 150–400)
RBC: 3.91 MIL/uL — ABNORMAL LOW (ref 4.22–5.81)
RDW: 21.2 % — ABNORMAL HIGH (ref 11.5–15.5)
WBC: 5.9 10*3/uL (ref 4.0–10.5)
nRBC: 0 % (ref 0.0–0.2)

## 2019-08-17 LAB — BASIC METABOLIC PANEL
Anion gap: 12 (ref 5–15)
BUN: 10 mg/dL (ref 8–23)
CO2: 30 mmol/L (ref 22–32)
Calcium: 9.4 mg/dL (ref 8.9–10.3)
Chloride: 99 mmol/L (ref 98–111)
Creatinine, Ser: 0.41 mg/dL — ABNORMAL LOW (ref 0.61–1.24)
GFR calc Af Amer: >60 mL/min (ref >60)
GFR calc non Af Amer: >60 mL/min (ref >60)
Glucose, Bld: 122 mg/dL — ABNORMAL HIGH (ref 70–99)
Potassium: 3.2 mmol/L — ABNORMAL LOW (ref 3.5–5.1)
Sodium: 141 mmol/L (ref 135–145)

## 2019-08-17 NOTE — Progress Notes (Signed)
Pulmonary Critical Care Medicine [facility] [place]   PULMONARY CRITICAL CARE SERVICE  PROGRESS NOTE  Date of Service: 08/17/2019  Caleb Rhodes  [id number]  DOB: 1956/01/15   DOA: 07/22/2019  Referring Physician: Carron Curie, MD  HPI: Caleb Rhodes is a 64 y.o. male seen for follow up of Acute on Chronic Respiratory Failure.  Patient is capping right now not requiring any oxygen likely to be discharged to rehab awaiting further input  Medications: Reviewed on Rounds  Physical Exam:  Vitals: Temperature 97.0 pulse 116 respiratory rate 26 blood pressure is 111/64 saturations 98%  Ventilator Settings capping off the ventilator right now  . General: Comfortable at this time . Eyes: Grossly normal lids, irises & conjunctiva . ENT: grossly tongue is normal . Neck: no obvious mass . Cardiovascular: S1 S2 normal no gallop . Respiratory: No rhonchi no rales are noted . Abdomen: soft . Skin: no rash seen on limited exam . Musculoskeletal: not rigid . Psychiatric:unable to assess . Neurologic: no seizure no involuntary movements         Lab Data:   Basic Metabolic Panel: Recent Labs  Lab 08/17/19 0617  NA 141  K 3.2*  CL 99  CO2 30  GLUCOSE 122*  BUN 10  CREATININE 0.41*  CALCIUM 9.4    ABG: No results for input(s): PHART, PCO2ART, PO2ART, HCO3, O2SAT in the last 168 hours.  Liver Function Tests: No results for input(s): AST, ALT, ALKPHOS, BILITOT, PROT, ALBUMIN in the last 168 hours. No results for input(s): LIPASE, AMYLASE in the last 168 hours. No results for input(s): AMMONIA in the last 168 hours.  CBC: Recent Labs  Lab 08/17/19 0617  WBC 5.9  HGB 10.7*  HCT 34.4*  MCV 88.0  PLT 346    Cardiac Enzymes: No results for input(s): CKTOTAL, CKMB, CKMBINDEX, TROPONINI in the last 168 hours.  BNP (last 3 results) No results for input(s): BNP in the last 8760 hours.  ProBNP (last 3 results) No results for input(s): PROBNP in the last  8760 hours.  Radiological Exams: No results found.  Assessment/Plan Active Problems:   Acute on chronic respiratory failure with hypoxia (HCC)   Severe sepsis (HCC)   Multifocal pneumonia   COVID-19 virus infection   Pneumonia due to COVID-19 virus   1. Acute on chronic respiratory failure with hypoxia awaiting discharge to rehab facility patient is better and should be able to eventually be decannulated. 2. Severe sepsis hemodynamics are stable at this time we will continue with supportive care 3. Multifocal pneumonia treated 4. COVID-19 virus infection resolved 5. Pneumonia due to COVID-19 clinically is improving we will continue to monitor.   I have personally seen and evaluated the patient, evaluated laboratory and imaging results, formulated the assessment and plan and placed orders. The Patient requires high complexity decision making with multiple systems involvement.  Rounds were done with the Respiratory Therapy Director and Staff therapists and discussed with nursing staff also.  Yevonne Pax, MD [facility] Pulmonary Critical Care Medicine Sleep Medicine

## 2019-08-18 DIAGNOSIS — J9621 Acute and chronic respiratory failure with hypoxia: Secondary | ICD-10-CM | POA: Diagnosis not present

## 2019-08-18 DIAGNOSIS — U071 COVID-19: Secondary | ICD-10-CM | POA: Diagnosis not present

## 2019-08-18 DIAGNOSIS — A419 Sepsis, unspecified organism: Secondary | ICD-10-CM | POA: Diagnosis not present

## 2019-08-18 DIAGNOSIS — J189 Pneumonia, unspecified organism: Secondary | ICD-10-CM | POA: Diagnosis not present

## 2019-08-18 LAB — POTASSIUM: Potassium: 3.9 mmol/L (ref 3.5–5.1)

## 2019-08-18 NOTE — Progress Notes (Addendum)
Pulmonary Critical Care Medicine [facility] [place]   PULMONARY CRITICAL CARE SERVICE  PROGRESS NOTE  Date of Service: 08/18/2019  Caleb Rhodes  [id number]:[id number]  DOB: 05/08/1956   DOA: 07/22/2019  Referring Physician: Carron Curie, MD  HPI: Caleb Rhodes is a 64 y.o. male seen for follow up of Acute on Chronic Respiratory Failure.  Patient remains capped on room air satting well no fever or distress.  Medications: Reviewed on Rounds  Physical Exam:  Vitals: Pulse 95 respirations 20 BP 104/52 O2 sat 99% temp 97.8  Ventilator Settings room air  . General: Comfortable at this time . Eyes: Grossly normal lids, irises & conjunctiva . ENT: grossly tongue is normal . Neck: no obvious mass . Cardiovascular: S1 S2 normal no gallop . Respiratory: No rales or rhonchi noted . Abdomen: soft . Skin: no rash seen on limited exam . Musculoskeletal: not rigid . Psychiatric:unable to assess . Neurologic: no seizure no involuntary movements         Lab Data:   Basic Metabolic Panel: Recent Labs  Lab 08/17/19 0617 08/18/19 0616  NA 141  --   K 3.2* 3.9  CL 99  --   CO2 30  --   GLUCOSE 122*  --   BUN 10  --   CREATININE 0.41*  --   CALCIUM 9.4  --     ABG: No results for input(s): PHART, PCO2ART, PO2ART, HCO3, O2SAT in the last 168 hours.  Liver Function Tests: No results for input(s): AST, ALT, ALKPHOS, BILITOT, PROT, ALBUMIN in the last 168 hours. No results for input(s): LIPASE, AMYLASE in the last 168 hours. No results for input(s): AMMONIA in the last 168 hours.  CBC: Recent Labs  Lab 08/17/19 0617  WBC 5.9  HGB 10.7*  HCT 34.4*  MCV 88.0  PLT 346    Cardiac Enzymes: No results for input(s): CKTOTAL, CKMB, CKMBINDEX, TROPONINI in the last 168 hours.  BNP (last 3 results) No results for input(s): BNP in the last 8760 hours.  ProBNP (last 3 results) No results for input(s): PROBNP in the last 8760 hours.  Radiological Exams: No results  found.  Assessment/Plan Active Problems:   Acute on chronic respiratory failure with hypoxia (HCC)   Severe sepsis (HCC)   Multifocal pneumonia   COVID-19 virus infection   Pneumonia due to COVID-19 virus   1. Acute on chronic respiratory failure with hypoxia patient continues to be capped on room air is awaiting discharge to rehab facility at this time continue supportive measures and pulmonary toilet. 2. Severe sepsis hemodynamics are stable at this time we will continue with supportive care 3. Multifocal pneumonia treated 4. COVID-19 virus infection resolved 5. Pneumonia due to COVID-19 clinically is improving we will continue to monitor.   I have personally seen and evaluated the patient, evaluated laboratory and imaging results, formulated the assessment and plan and placed orders. The Patient requires high complexity decision making with multiple systems involvement.  Rounds were done with the Respiratory Therapy Director and Staff therapists and discussed with nursing staff also.  Yevonne Pax, MD [facility] Pulmonary Critical Care Medicine Sleep Medicine

## 2019-08-19 LAB — NOVEL CORONAVIRUS, NAA (HOSP ORDER, SEND-OUT TO REF LAB; TAT 18-24 HRS): SARS-CoV-2, NAA: NOT DETECTED

## 2019-09-21 MED ORDER — PROPOFOL 100 MG/10ML IV EMUL
0.00 | INTRAVENOUS | Status: DC
Start: ? — End: 2019-09-21

## 2019-09-21 MED ORDER — NOREPINEPHRINE-SODIUM CHLORIDE 8-0.9 MG/250ML-% IV SOLN
0.00 | INTRAVENOUS | Status: DC
Start: ? — End: 2019-09-21

## 2019-09-21 MED ORDER — ASPIRIN 300 MG RE SUPP
300.00 | RECTAL | Status: DC
Start: 2019-09-22 — End: 2019-09-21

## 2019-09-21 MED ORDER — ATORVASTATIN CALCIUM 40 MG PO TABS
80.00 | ORAL_TABLET | ORAL | Status: DC
Start: 2019-09-22 — End: 2019-09-21

## 2019-09-21 MED ORDER — WH PETROL-MINERAL OIL-LANOLIN 0.1-0.1 % OP OINT
1.00 | TOPICAL_OINTMENT | OPHTHALMIC | Status: DC
Start: 2019-09-21 — End: 2019-09-21

## 2019-09-21 MED ORDER — COCONUT OIL OIL
1.00 | TOPICAL_OIL | Status: DC
Start: 2019-09-22 — End: 2019-09-21

## 2019-09-21 MED ORDER — PANTOPRAZOLE SODIUM 40 MG IV SOLR
40.00 | INTRAVENOUS | Status: DC
Start: 2019-09-22 — End: 2019-09-21

## 2019-10-23 DEATH — deceased

## 2021-07-31 IMAGING — DX DG CHEST 1V PORT
1 series · 1 of 1 positions shown · non-contrast
Comparison: None.

CLINICAL DATA: History of pneumonia

EXAM:
PORTABLE CHEST 1 VIEW

[chest ap]
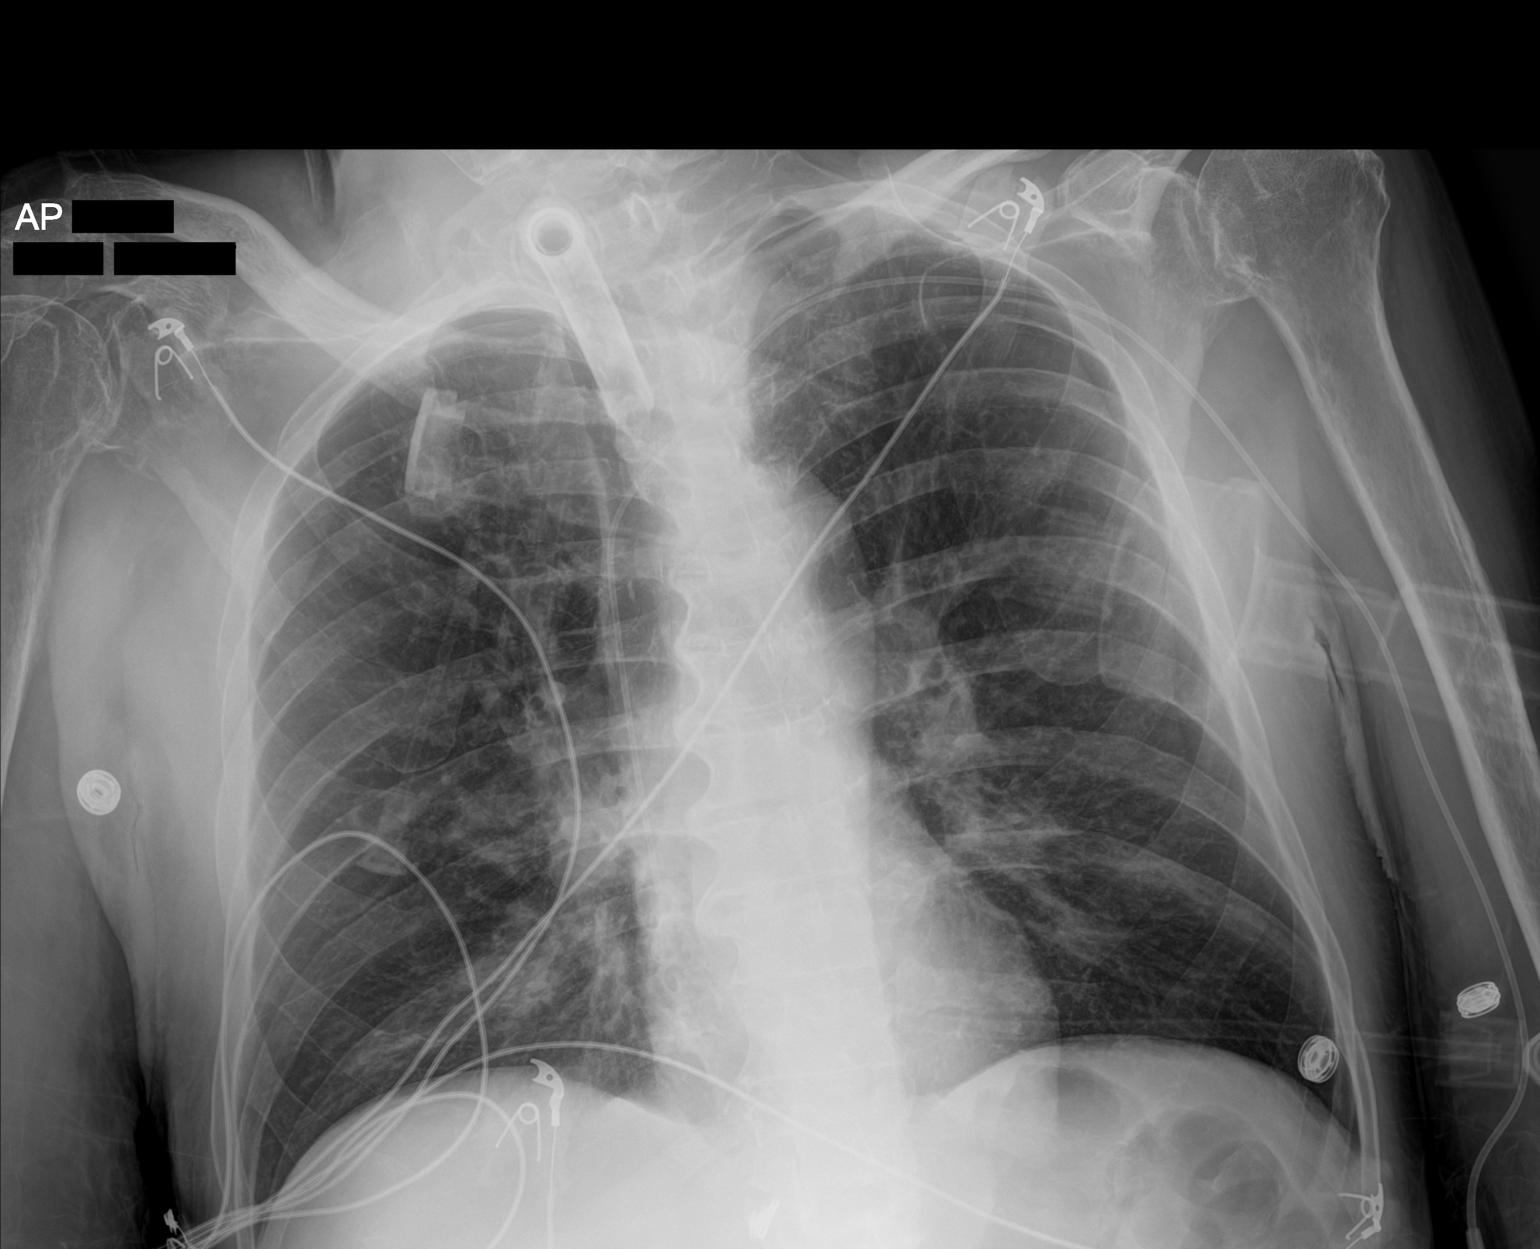

[1 of 1 positions shown; findings below may reference images not displayed]

FINDINGS: Tracheostomy tube in place with tip about 4.7 cm superior to the
carina. Left upper extremity central venous catheter tip over the
distal SVC. Mild right infrahilar airspace opacity. No pleural
effusion. Normal heart size. No pneumothorax.
IMPRESSION: Mild right infrahilar atelectasis or small pneumonia. Minimal
streaky atelectasis in the left infrahilar lung
